# Patient Record
Sex: Female | Born: 1960 | Race: White | Hispanic: No | Marital: Married | Smoking: Never smoker
Health system: Southern US, Community
[De-identification: ages and names within clinical notes are randomized; demographics above are authoritative.]

## PROBLEM LIST (undated history)

## (undated) DIAGNOSIS — Z8489 Family history of other specified conditions: Secondary | ICD-10-CM

## (undated) DIAGNOSIS — N952 Postmenopausal atrophic vaginitis: Secondary | ICD-10-CM

## (undated) DIAGNOSIS — M5136 Other intervertebral disc degeneration, lumbar region: Secondary | ICD-10-CM

## (undated) DIAGNOSIS — J302 Other seasonal allergic rhinitis: Secondary | ICD-10-CM

## (undated) DIAGNOSIS — R5383 Other fatigue: Secondary | ICD-10-CM

## (undated) DIAGNOSIS — R638 Other symptoms and signs concerning food and fluid intake: Secondary | ICD-10-CM

## (undated) DIAGNOSIS — R200 Anesthesia of skin: Secondary | ICD-10-CM

## (undated) DIAGNOSIS — IMO0002 Reserved for concepts with insufficient information to code with codable children: Secondary | ICD-10-CM

## (undated) DIAGNOSIS — E079 Disorder of thyroid, unspecified: Secondary | ICD-10-CM

## (undated) DIAGNOSIS — I10 Essential (primary) hypertension: Secondary | ICD-10-CM

## (undated) DIAGNOSIS — H168 Other keratitis: Secondary | ICD-10-CM

## (undated) DIAGNOSIS — M51369 Other intervertebral disc degeneration, lumbar region without mention of lumbar back pain or lower extremity pain: Secondary | ICD-10-CM

## (undated) DIAGNOSIS — Z780 Asymptomatic menopausal state: Secondary | ICD-10-CM

## (undated) HISTORY — DX: Reserved for concepts with insufficient information to code with codable children: IMO0002

## (undated) HISTORY — DX: Postmenopausal atrophic vaginitis: N95.2

## (undated) HISTORY — DX: Other intervertebral disc degeneration, lumbar region: M51.36

## (undated) HISTORY — DX: Disorder of thyroid, unspecified: E07.9

## (undated) HISTORY — DX: Other keratitis: H16.8

## (undated) HISTORY — PX: TONSILLECTOMY: SUR1361

## (undated) HISTORY — DX: Other symptoms and signs concerning food and fluid intake: R63.8

## (undated) HISTORY — DX: Other fatigue: R53.83

## (undated) HISTORY — PX: CARDIAC CATHETERIZATION: SHX172

## (undated) HISTORY — DX: Essential (primary) hypertension: I10

## (undated) HISTORY — DX: Other seasonal allergic rhinitis: J30.2

## (undated) HISTORY — DX: Asymptomatic menopausal state: Z78.0

## (undated) HISTORY — DX: Other intervertebral disc degeneration, lumbar region without mention of lumbar back pain or lower extremity pain: M51.369

---
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
## 2000-08-13 HISTORY — PX: VAGINAL HYSTERECTOMY: SUR661

## 2000-10-21 ENCOUNTER — Encounter: Payer: Self-pay | Admitting: Cardiovascular Disease

## 2004-05-19 ENCOUNTER — Ambulatory Visit: Payer: Self-pay | Admitting: Gastroenterology

## 2005-01-30 ENCOUNTER — Ambulatory Visit: Payer: Self-pay | Admitting: Family Medicine

## 2006-02-22 ENCOUNTER — Ambulatory Visit: Payer: Self-pay | Admitting: Obstetrics and Gynecology

## 2006-12-31 ENCOUNTER — Ambulatory Visit: Payer: Self-pay

## 2007-02-25 ENCOUNTER — Ambulatory Visit: Payer: Self-pay | Admitting: Obstetrics and Gynecology

## 2008-03-04 ENCOUNTER — Ambulatory Visit: Payer: Self-pay | Admitting: Obstetrics and Gynecology

## 2009-03-08 ENCOUNTER — Ambulatory Visit: Payer: Self-pay

## 2009-08-13 LAB — HM COLONOSCOPY: HM COLON: NORMAL

## 2009-10-27 ENCOUNTER — Ambulatory Visit: Payer: Self-pay | Admitting: Gastroenterology

## 2010-01-03 ENCOUNTER — Encounter: Payer: Self-pay | Admitting: Cardiovascular Disease

## 2010-03-09 ENCOUNTER — Ambulatory Visit: Payer: Self-pay

## 2010-12-12 ENCOUNTER — Encounter: Payer: Self-pay | Admitting: Cardiovascular Disease

## 2010-12-19 ENCOUNTER — Ambulatory Visit: Payer: Self-pay | Admitting: Family Medicine

## 2010-12-25 ENCOUNTER — Ambulatory Visit (INDEPENDENT_AMBULATORY_CARE_PROVIDER_SITE_OTHER): Payer: No Typology Code available for payment source | Admitting: Cardiovascular Disease

## 2010-12-25 ENCOUNTER — Encounter: Payer: Self-pay | Admitting: Cardiovascular Disease

## 2010-12-25 DIAGNOSIS — E669 Obesity, unspecified: Secondary | ICD-10-CM

## 2010-12-25 DIAGNOSIS — I10 Essential (primary) hypertension: Secondary | ICD-10-CM | POA: Insufficient documentation

## 2010-12-25 DIAGNOSIS — R0602 Shortness of breath: Secondary | ICD-10-CM

## 2010-12-25 DIAGNOSIS — R002 Palpitations: Secondary | ICD-10-CM

## 2010-12-25 MED ORDER — METOPROLOL TARTRATE 25 MG PO TABS: 25.0000 mg | ORAL_TABLET | Freq: Two times a day (BID) | ORAL | Status: DC | PRN

## 2010-12-25 NOTE — Assessment & Plan Note (Signed)
She does have occasional palpitations. This could be secondary to an elevated heart rate or to ectopy. We have suggested she start the beta blocker as mentioned.

## 2010-12-25 NOTE — Patient Instructions (Signed)
Start Metoprolol Tartrate 25mg  twice daily as needed for palpitations.  You can also try Bystolic 5mg  samples, to see if these work better for you. **IF you do take metoprolol OR Bystolic, cut Diovan dose in HALF** Please call our office with any issues in the future, otherwise, you can follow up as needed.

## 2010-12-25 NOTE — Progress Notes (Signed)
   Patient ID: Shari Long, female    DOB: 08/30/1960, 50 y.o.   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  She reports that she started to work with a trainer several months ago. She worked for 6 weeks with him and has lost 20 pounds. During this period, she has had significant periods of tachycardia palpitations, shortness of breath. She stopped 3 weeks ago and has felt better since then. She is able to do her aerobic activities with no significant symptoms of shortness of breath. She also reports having occasional dizziness with exertion. After she has worked out, she has some exhaustion, fatigue, sometimes some chest heaviness.  With her weight decreasing, her cholesterol has decreased from 170s down to 155, LDL 100.  EKG shows normal sinus rhythm with rate 92 beats per minute and his overall essentially normal     Review of Systems  Constitutional: Negative.   HENT: Negative.   Eyes: Negative.   Respiratory: Positive for shortness of breath.   Cardiovascular: Positive for chest pain and palpitations.  Gastrointestinal: Negative.   Musculoskeletal: Negative.   Skin: Negative.   Neurological: Negative.   Hematological: Negative.   Psychiatric/Behavioral: Negative.   All other systems reviewed and are negative.    BP 122/86  Pulse 92  Ht 5\' 4"  (1.626 m)  Wt 196 lb (88.905 kg)  BMI 33.64 kg/m2   Physical Exam  Nursing note and vitals reviewed. Constitutional: She is oriented to person, place, and time. She appears well-developed and well-nourished.  HENT:  Head: Normocephalic.  Nose:  Nose normal.  Mouth/Throat: Oropharynx is clear and moist.  Eyes: Conjunctivae are normal. Pupils are equal, round, and reactive to light.  Neck: Normal range of motion. Neck supple. No JVD present.  Cardiovascular: Normal rate, regular rhythm, S1 normal, S2 normal, normal heart sounds and intact distal pulses.  Exam reveals no gallop and no friction rub.   No murmur heard. Pulmonary/Chest: Effort normal and breath sounds normal. No respiratory distress. She has no wheezes. She has no rales. She exhibits no tenderness.  Abdominal: Soft. Bowel sounds are normal. She exhibits no distension. There is no tenderness.  Musculoskeletal: Normal range of motion. She exhibits no edema and no tenderness.  Lymphadenopathy:    She has no cervical adenopathy.  Neurological: She is alert and oriented to person, place, and time. Coordination normal.  Skin: Skin is warm and dry. No rash noted. No erythema.  Psychiatric: She has a normal mood and affect. Her behavior is normal. Judgment and thought content normal.         Assessment and Plan

## 2010-12-25 NOTE — Assessment & Plan Note (Signed)
Etiology of her shortness of breath could be secondary to an elevated heart rate. At rest on EKG and exam, her heart rate is in the high 80s, low 90s. She reports that her heart rate is typically always elevated. We have suggested that she try a low-dose beta blocker prior to any workup. We have suggested she cut her Diovan and half, take metoprolol tartrate either as needed or on a regular basis b.i.d.. Alternatively, she could try Bystolic 5 mg and we have given her some samples to try.  If she does not have improvement of her shortness of breath with better heart rate, we have suggested that we could do additional workup including echocardiogram, treadmill study.

## 2010-12-25 NOTE — Assessment & Plan Note (Signed)
Blood pressure is well-controlled on her visit today. We have suggested that if she had a low-dose beta blocker, that she cut her Diovan in half.

## 2010-12-25 NOTE — Assessment & Plan Note (Signed)
She has dropped her weight 20 pounds with working with the trainer. We have complement of her ear it we have suggested she continue to exercise as she is doing.

## 2011-02-20 ENCOUNTER — Ambulatory Visit: Payer: Self-pay

## 2011-03-02 ENCOUNTER — Telehealth: Payer: Self-pay

## 2011-03-02 NOTE — Telephone Encounter (Signed)
Patient calling with medication problems. She just finished her bystolic samples and was told to take Metoprolol 25 mg 1/2 tablet twice a day instead she is taking once a day. She started the metoprolol 25 mg 1/2 tablet once daily on 02/24/2011. The patient states the bystolic seemed to work much better than the metoprolol.  She has felt weak with the metoprolol and has more break through palpitations and shortness of breath.  She did not feel this way on the bystolic. What do you recommend she do?

## 2011-03-04 NOTE — Telephone Encounter (Signed)
We can provide additional samples of bystolic 5 mg She should monitor her heart rate. If elevated, may need to try 10 mg daily. Once we determine which dose (5 or 10 mg), we can giver her a scipt with copay card.  We can also write for scipt for 10 mg with copay card and she can try the 10 mg or cut in half for 5 mg

## 2011-03-05 NOTE — Telephone Encounter (Signed)
Notified regarding the metoprolol and bystolic. Gave samples for bystolic 10 mg; told should take 1/2 tablet and if the heart rate seems to be getting worse take the other half tablet to equal the bystolic 10 mg.  Told her to monitor blood pressure and heart rate and to let us know which dose works best of the bystolic and we can give her a Rx with a copay card to help with the expense.

## 2011-10-17 ENCOUNTER — Other Ambulatory Visit: Payer: Self-pay | Admitting: Pain Medicine

## 2011-10-17 ENCOUNTER — Ambulatory Visit: Payer: Self-pay | Admitting: Pain Medicine

## 2011-10-17 LAB — CBC WITH DIFFERENTIAL/PLATELET
Basophil #: 0 10*3/uL (ref 0.0–0.1)
Eosinophil #: 0.3 10*3/uL (ref 0.0–0.7)
Eosinophil %: 3.4 %
HCT: 42.1 % (ref 35.0–47.0)
HGB: 14.2 g/dL (ref 12.0–16.0)
Lymphocyte #: 2.2 10*3/uL (ref 1.0–3.6)
Lymphocyte %: 29.3 %
MCH: 29.3 pg (ref 26.0–34.0)
MCHC: 33.8 g/dL (ref 32.0–36.0)
Monocyte #: 0.5 10*3/uL (ref 0.0–0.7)
Neutrophil #: 4.4 10*3/uL (ref 1.4–6.5)
Neutrophil %: 59.4 %
RBC: 4.86 10*6/uL (ref 3.80–5.20)

## 2011-10-31 ENCOUNTER — Ambulatory Visit: Payer: Self-pay | Admitting: Pain Medicine

## 2012-02-21 ENCOUNTER — Ambulatory Visit: Payer: Self-pay | Admitting: Family Medicine

## 2012-07-18 ENCOUNTER — Other Ambulatory Visit: Payer: Self-pay | Admitting: Physician Assistant

## 2012-07-25 ENCOUNTER — Ambulatory Visit: Payer: Self-pay | Admitting: Unknown Physician Specialty

## 2012-08-11 ENCOUNTER — Ambulatory Visit: Payer: Self-pay | Admitting: Unknown Physician Specialty

## 2013-02-25 ENCOUNTER — Ambulatory Visit: Payer: Self-pay | Admitting: Family Medicine

## 2013-10-14 ENCOUNTER — Other Ambulatory Visit: Payer: Self-pay | Admitting: Family Medicine

## 2013-10-14 LAB — COMPREHENSIVE METABOLIC PANEL
ALBUMIN: 3.7 g/dL (ref 3.4–5.0)
ALK PHOS: 134 U/L — AB
ANION GAP: 3 — AB (ref 7–16)
AST: 36 U/L (ref 15–37)
BUN: 11 mg/dL (ref 7–18)
Bilirubin,Total: 0.3 mg/dL (ref 0.2–1.0)
Calcium, Total: 9.5 mg/dL (ref 8.5–10.1)
Chloride: 106 mmol/L (ref 98–107)
Co2: 30 mmol/L (ref 21–32)
Creatinine: 0.72 mg/dL (ref 0.60–1.30)
EGFR (African American): >60
GLUCOSE: 83 mg/dL (ref 65–99)
Osmolality: 276 (ref 275–301)
POTASSIUM: 3.7 mmol/L (ref 3.5–5.1)
SGPT (ALT): 39 U/L (ref 12–78)
SODIUM: 139 mmol/L (ref 136–145)
TOTAL PROTEIN: 7.3 g/dL (ref 6.4–8.2)

## 2013-10-14 LAB — TSH: THYROID STIMULATING HORM: 0.014 u[IU]/mL — AB

## 2014-03-17 ENCOUNTER — Ambulatory Visit: Payer: Self-pay | Admitting: Family Medicine

## 2014-04-28 ENCOUNTER — Ambulatory Visit: Payer: Self-pay | Admitting: Family Medicine

## 2014-06-18 ENCOUNTER — Ambulatory Visit: Payer: Self-pay | Admitting: Family Medicine

## 2014-07-23 ENCOUNTER — Ambulatory Visit: Payer: Self-pay | Admitting: Gastroenterology

## 2014-08-13 LAB — HM MAMMOGRAPHY: HM Mammogram: NORMAL (ref 0–4)

## 2014-11-23 ENCOUNTER — Encounter: Payer: Self-pay | Admitting: *Deleted

## 2014-11-24 ENCOUNTER — Encounter: Payer: Self-pay | Admitting: *Deleted

## 2014-12-06 LAB — SURGICAL PATHOLOGY

## 2015-02-15 ENCOUNTER — Other Ambulatory Visit: Payer: Self-pay | Admitting: Family Medicine

## 2015-03-11 ENCOUNTER — Other Ambulatory Visit: Payer: Self-pay | Admitting: Family Medicine

## 2015-03-11 DIAGNOSIS — Z1231 Encounter for screening mammogram for malignant neoplasm of breast: Secondary | ICD-10-CM

## 2015-03-11 NOTE — Telephone Encounter (Signed)
Pt has not been seen since 04/20/14. Please advise

## 2015-03-22 ENCOUNTER — Ambulatory Visit
Admission: RE | Admit: 2015-03-22 | Discharge: 2015-03-22 | Disposition: A | Discharge status: Home / Self Care | Payer: 59 | Source: Ambulatory Visit | Attending: Family Medicine | Admitting: Family Medicine

## 2015-03-22 ENCOUNTER — Other Ambulatory Visit: Payer: Self-pay | Admitting: Family Medicine

## 2015-03-22 DIAGNOSIS — Z1231 Encounter for screening mammogram for malignant neoplasm of breast: Secondary | ICD-10-CM

## 2015-05-17 ENCOUNTER — Encounter: Payer: Self-pay | Admitting: Family Medicine

## 2015-05-17 ENCOUNTER — Ambulatory Visit (INDEPENDENT_AMBULATORY_CARE_PROVIDER_SITE_OTHER): Payer: 59 | Admitting: Family Medicine

## 2015-05-17 VITALS — BP 122/80 | HR 90 | Temp 98.8°F | Resp 16 | Ht 64.0 in | Wt 198.8 lb

## 2015-05-17 DIAGNOSIS — E669 Obesity, unspecified: Secondary | ICD-10-CM | POA: Diagnosis not present

## 2015-05-17 DIAGNOSIS — F329 Major depressive disorder, single episode, unspecified: Secondary | ICD-10-CM

## 2015-05-17 DIAGNOSIS — I10 Essential (primary) hypertension: Secondary | ICD-10-CM

## 2015-05-17 DIAGNOSIS — F32A Depression, unspecified: Secondary | ICD-10-CM

## 2015-05-17 DIAGNOSIS — E039 Hypothyroidism, unspecified: Secondary | ICD-10-CM | POA: Diagnosis not present

## 2015-05-17 MED ORDER — VALSARTAN-HYDROCHLOROTHIAZIDE 160-12.5 MG PO TABS: 1.0000 | ORAL_TABLET | Freq: Every day | ORAL | Status: DC

## 2015-05-17 MED ORDER — LEVOTHYROXINE SODIUM 125 MCG PO TABS: 125.0000 ug | ORAL_TABLET | Freq: Every day | ORAL | Status: DC

## 2015-05-17 MED ORDER — HYDROCHLOROTHIAZIDE 25 MG PO TABS: 25.0000 mg | ORAL_TABLET | ORAL | Status: DC | PRN

## 2015-05-17 MED ORDER — BUPROPION HCL ER (XL) 300 MG PO TB24: 300.0000 mg | ORAL_TABLET | Freq: Every day | ORAL | Status: DC

## 2015-05-17 NOTE — Progress Notes (Signed)
Name: Shari Long   MRN: [id number]    DOB: February 24, 1961   Date:05/17/2015       Progress Note  Subjective  Chief Complaint  Chief Complaint  Patient presents with  . Depression    4 month follow up  . Hypothyroidism  . Hypertension    HPI  Hypertension   Patient presents for follow-up of hypertension. It has been present for over 5 years.  Patient states that there is compliance with medical regimen which consists of  Metoprolol 25 mg daily and hydrochlorothiazide 25 mg daily. There is no end organ disease. Cardiac risk factors include hypertension hyperlipidemia and diabetes.  Exercise regimen consist of some calisthenics as well as some aerobic exercise .  Diet consist of low-fat low carb .  Obesity  Patient has a history of obesity for many years.  Attempts at weight loss have included diet and exercise and dieting .  Results of this regimen  have been successful over the past several months with the loss of 16 pounds .  Patient now voices and interest in weight loss by  diet and exercise    Hypothyroidism  . It has been present for over 5 years.  Current symptoms consist of palpitations occasionally . Current medication regimen consist of levothyroxin 125 g and Cytomel 25 g daily micrograms daily .   There is good compliance with regimen.    Depression  Patient depression symptoms are doing well currently. She remains on Wellbutrin  Past Medical History  Diagnosis Date  . Hypertension   . Thyroid disease     hypothyroidism  . Degenerative disc disease     Social History  Substance Use Topics  . Smoking status: Never Smoker   . Smokeless tobacco: Not on file  . Alcohol Use: 0.5 oz/week    1 drink(s) per week     Current outpatient prescriptions:  .  aspirin 81 MG tablet, Take 81 mg by mouth daily.  , Disp: , Rfl:  .  buPROPion (WELLBUTRIN XL) 300 MG 24 hr tablet, Take 1 tablet (300 mg total) by mouth daily., Disp: 90 tablet, Rfl: 1 .  calcium  citrate-vitamin D 200-200 MG-UNIT TABS, Take 1 tablet by mouth daily.  , Disp: , Rfl:  .  hydrochlorothiazide (HYDRODIURIL) 25 MG tablet, Take 1 tablet (25 mg total) by mouth as needed., Disp: 90 tablet, Rfl: 1 .  levothyroxine (SYNTHROID, LEVOTHROID) 125 MCG tablet, Take 1 tablet (125 mcg total) by mouth daily., Disp: 90 tablet, Rfl: 1 .  liothyronine (CYTOMEL) 25 MCG tablet, Take 25 mcg by mouth daily.  , Disp: , Rfl:  .  metoprolol tartrate (LOPRESSOR) 25 MG tablet, Take 1 tablet (25 mg total) by mouth 2 (two) times daily as needed., Disp: 60 tablet, Rfl: 6 .  Omega-3 Fatty Acids (FISH OIL) 1000 MG CAPS, Take by mouth.  , Disp: , Rfl:  .  valsartan-hydrochlorothiazide (DIOVAN-HCT) 160-12.5 MG tablet, Take 1 tablet by mouth daily., Disp: 90 tablet, Rfl: 1 .  vitamin E 400 UNIT capsule, Take 400 Units by mouth daily.  , Disp: , Rfl:   No Known Allergies  Review of Systems  Constitutional: Negative for fever, chills and weight loss.  HENT: Negative for congestion, hearing loss, sore throat and tinnitus.   Eyes: Negative for blurred vision, double vision and redness.  Respiratory: Negative for cough, hemoptysis and shortness of breath.   Cardiovascular: Positive for palpitations. Negative for chest pain, orthopnea, claudication and leg swelling.  Gastrointestinal:  Negative for heartburn, nausea, vomiting, diarrhea, constipation and blood in stool.  Genitourinary: Negative for dysuria, urgency, frequency and hematuria.  Musculoskeletal: Negative for myalgias, back pain, joint pain, falls and neck pain.  Skin: Negative for itching.  Neurological: Negative for dizziness, tingling, tremors, focal weakness, seizures, loss of consciousness, weakness and headaches.  Endo/Heme/Allergies: Does not bruise/bleed easily.  Psychiatric/Behavioral: Positive for depression. Negative for substance abuse. The patient is not nervous/anxious and does not have insomnia.      Objective  Filed Vitals:    05/17/15 0749  BP: 122/80  Pulse: 90  Temp: 98.8 F (37.1 C)  TempSrc: Oral  Resp: 16  Height:  (1.626 m)  Weight: 198 lb 12.8 oz (90.175 kg)  SpO2: 96%     Physical Exam  Constitutional: She is oriented to person, place, and time and well-developed, well-nourished, and in no distress.  Modestly obese in no acute distress  HENT:  Head: Normocephalic.  Eyes: EOM are normal. Pupils are equal, round, and reactive to light.  Neck: Normal range of motion. No thyromegaly present.  Cardiovascular: Normal rate, regular rhythm and normal heart sounds.   No murmur heard. Pulmonary/Chest: Effort normal and breath sounds normal.  Abdominal: Soft. Bowel sounds are normal.  Musculoskeletal: Normal range of motion. Tenderness: trace.  Neurological: She is alert and oriented to person, place, and time. No cranial nerve deficit. Gait normal.  Skin: Skin is warm and dry. No rash noted.  Psychiatric: Memory and affect normal.      Assessment & Plan  1. Hypothyroidism, unspecified hypothyroidism type Recheck labs - Lipid Profile - TSH - T3 Uptake  2. Essential hypertension Recheck labs  3. Obesity improved  4. Depression stable - Comprehensive Metabolic Panel (CMET)

## 2015-05-19 LAB — COMPREHENSIVE METABOLIC PANEL
ALK PHOS: 112 IU/L (ref 39–117)
ALT: 20 IU/L (ref 0–32)
AST: 26 IU/L (ref 0–40)
Albumin/Globulin Ratio: 1.5 (ref 1.1–2.5)
Albumin: 4.3 g/dL (ref 3.5–5.5)
BUN/Creatinine Ratio: 13 (ref 9–23)
BUN: 11 mg/dL (ref 6–24)
Bilirubin Total: 0.2 mg/dL (ref 0.0–1.2)
CALCIUM: 9.7 mg/dL (ref 8.7–10.2)
CO2: 26 mmol/L (ref 18–29)
CREATININE: 0.86 mg/dL (ref 0.57–1.00)
Chloride: 100 mmol/L (ref 97–108)
GFR calc Af Amer: 88 mL/min/{1.73_m2} (ref >59)
GFR, EST NON AFRICAN AMERICAN: 76 mL/min/{1.73_m2} (ref >59)
Globulin, Total: 2.8 g/dL (ref 1.5–4.5)
Glucose: 94 mg/dL (ref 65–99)
POTASSIUM: 4.6 mmol/L (ref 3.5–5.2)
Sodium: 140 mmol/L (ref 134–144)
Total Protein: 7.1 g/dL (ref 6.0–8.5)

## 2015-05-19 LAB — LIPID PANEL
CHOL/HDL RATIO: 4 ratio (ref 0.0–4.4)
CHOLESTEROL TOTAL: 207 mg/dL — AB (ref 100–199)
HDL: 52 mg/dL (ref >39)
LDL CALC: 135 mg/dL — AB (ref 0–99)
TRIGLYCERIDES: 98 mg/dL (ref 0–149)
VLDL Cholesterol Cal: 20 mg/dL (ref 5–40)

## 2015-05-19 LAB — T3 UPTAKE: T3 Uptake Ratio: 27 % (ref 24–39)

## 2015-05-19 LAB — TSH: TSH: 0.803 u[IU]/mL (ref 0.450–4.500)

## 2015-05-24 ENCOUNTER — Telehealth: Payer: Self-pay | Admitting: Emergency Medicine

## 2015-05-24 NOTE — Telephone Encounter (Signed)
Patient notified

## 2015-07-13 ENCOUNTER — Encounter: Payer: Self-pay | Admitting: Physician Assistant

## 2015-07-13 ENCOUNTER — Ambulatory Visit: Payer: Self-pay | Admitting: Physician Assistant

## 2015-07-13 VITALS — BP 124/90 | HR 82 | Temp 98.4°F

## 2015-07-13 DIAGNOSIS — H65192 Other acute nonsuppurative otitis media, left ear: Secondary | ICD-10-CM

## 2015-07-13 MED ORDER — PREDNISONE 10 MG PO TABS
30.0000 mg | ORAL_TABLET | Freq: Every day | ORAL | Status: DC
Start: 2015-07-13 — End: 2015-11-24

## 2015-07-13 MED ORDER — AMOXICILLIN 875 MG PO TABS: 875.0000 mg | ORAL_TABLET | Freq: Two times a day (BID) | ORAL | Status: DC

## 2015-07-13 MED ORDER — FLUCONAZOLE 150 MG PO TABS: 150.0000 mg | ORAL_TABLET | Freq: Once | ORAL | Status: DC

## 2015-07-13 NOTE — Progress Notes (Signed)
S:  C/o ears popping and being stopped up, a lot or pressure,  no drainage from ears, no fever/chills, some cough and congestion, some sinus pressure, remainder ros neg, mucus was yellow but has turned clear Using otc meds without relief  O:  Vitals wnl, nad, tms dull b/l, left is pink;  nasal mucosa swollen, throat wnl, neck supple no lymph, lungs c t a, cv rrr, neuro intact  A: acute otitis media  P: amoxil 875mg  bid x 10d, diflucan if needed, prednisone 30mg  qd x 3d, return if not improving in 3 to 5 days, return earlier if worsening

## 2015-11-21 LAB — HM PAP SMEAR

## 2015-11-24 ENCOUNTER — Ambulatory Visit (INDEPENDENT_AMBULATORY_CARE_PROVIDER_SITE_OTHER): Payer: 59 | Admitting: Obstetrics and Gynecology

## 2015-11-24 ENCOUNTER — Encounter: Payer: Self-pay | Admitting: Obstetrics and Gynecology

## 2015-11-24 VITALS — BP 126/83 | HR 76 | Ht 64.0 in | Wt 209.6 lb

## 2015-11-24 DIAGNOSIS — Z780 Asymptomatic menopausal state: Secondary | ICD-10-CM | POA: Diagnosis not present

## 2015-11-24 DIAGNOSIS — E669 Obesity, unspecified: Secondary | ICD-10-CM

## 2015-11-24 DIAGNOSIS — Z1239 Encounter for other screening for malignant neoplasm of breast: Secondary | ICD-10-CM

## 2015-11-24 DIAGNOSIS — N952 Postmenopausal atrophic vaginitis: Secondary | ICD-10-CM

## 2015-11-24 DIAGNOSIS — Z01419 Encounter for gynecological examination (general) (routine) without abnormal findings: Secondary | ICD-10-CM

## 2015-11-24 DIAGNOSIS — Z0000 Encounter for general adult medical examination without abnormal findings: Secondary | ICD-10-CM

## 2015-11-24 DIAGNOSIS — Z90710 Acquired absence of both cervix and uterus: Secondary | ICD-10-CM | POA: Diagnosis not present

## 2015-11-24 MED ORDER — ESTRADIOL 0.1 MG/GM VA CREA: 1.0000 | TOPICAL_CREAM | VAGINAL | Status: DC

## 2015-11-24 NOTE — Progress Notes (Signed)
GYN ANNUAL PREVENTATIVE CARE ENCOUNTER NOTE  Subjective:       Shari Long is a 55 y.o. G45P2000 female here for a routine annual gynecologic exam.  Current complaints: None.      Status post TVH. Weight is an ongoing issue with fluctuating increase and decrease. Gynecologic History No LMP recorded. Patient has had a hysterectomy. Contraception: status post hysterectomy Last Pap: Normal Last mammogram: Normal  Obstetric History OB History  Gravida Para Term Preterm AB SAB TAB Ectopic Multiple Living  # Outcome Date GA Lbr Len/2nd Weight Sex Delivery Anes PTL Lv  2 Term      Vag-Spont     1 Term      Vag-Spont         Past Medical History  Diagnosis Date  . Hypertension   . Thyroid disease     hypothyroidism  . Degenerative disc disease   . DDD (degenerative disc disease), lumbar   . Seasonal allergies   . Vaginal atrophy   . Increased BMI   . Menopause   . Fatigue     Past Surgical History  Procedure Laterality Date  . Cardiac catheterization    . Foot surgery  1978    left foot   . Tonsillectomy    . Vaginal hysterectomy  2002    Current Outpatient Prescriptions on File Prior to Visit  Medication Sig Dispense Refill  . amoxicillin (AMOXIL) 875 MG tablet Take 1 tablet (875 mg total) by mouth 2 (two) times daily. 20 tablet 0  . aspirin 81 MG tablet Take 81 mg by mouth daily.      Marland Kitchen buPROPion (WELLBUTRIN XL) 300 MG 24 hr tablet Take 1 tablet (300 mg total) by mouth daily. 90 tablet 1  . buPROPion (WELLBUTRIN XL) 300 MG 24 hr tablet Take by mouth.    . calcium citrate-vitamin D 200-200 MG-UNIT TABS Take 1 tablet by mouth daily.      . fluconazole (DIFLUCAN) 150 MG tablet Take 1 tablet (150 mg total) by mouth once. 1 tablet 0  . hydrochlorothiazide (HYDRODIURIL) 25 MG tablet Take 1 tablet (25 mg total) by mouth as needed. (Patient not taking: Reported on 07/13/2015) 90 tablet 1  . levothyroxine (SYNTHROID, LEVOTHROID) 125 MCG tablet Take 1  tablet (125 mcg total) by mouth daily. 90 tablet 1  . liothyronine (CYTOMEL) 25 MCG tablet Take 25 mcg by mouth daily.      . metoprolol tartrate (LOPRESSOR) 25 MG tablet Take 1 tablet (25 mg total) by mouth 2 (two) times daily as needed. 60 tablet 6  . Omega-3 Fatty Acids (FISH OIL) 1000 MG CAPS Take by mouth.      . predniSONE (DELTASONE) 10 MG tablet Take 3 tablets (30 mg total) by mouth daily with breakfast. 9 tablet 0  . valsartan-hydrochlorothiazide (DIOVAN-HCT) 160-12.5 MG tablet Take 1 tablet by mouth daily. 90 tablet 1  . vitamin E 400 UNIT capsule Take 400 Units by mouth daily.       No current facility-administered medications on file prior to visit.    Allergies  Allergen Reactions  . Penicillins Rash    Social History   Social History  . Marital Status: Married    Spouse Name: N/A  . Number of Children: N/A  . Years of Education: N/A   Occupational History  . Not on file.   Social History Main Topics  . Smoking status: Never  Smoker   . Smokeless tobacco: Not on file  . Alcohol Use: 0.5 oz/week    1 Standard drinks or equivalent per week  . Drug Use: No  . Sexual Activity: Yes    Birth Control/ Protection: Surgical   Other Topics Concern  . Not on file   Social History Narrative    Family History  Problem Relation Age of Onset  . Breast cancer Paternal Aunt 48    great  . Breast cancer Paternal Uncle [id number]  . Colon cancer Paternal Uncle   . Breast cancer Paternal Grandmother [id number]  . Cancer - Cervical Mother   . Diabetes Mother   . Breast cancer Cousin     ovarian (50's), breast(50's) and uterine(60's) cancer on maternal side.   . Ovarian cancer Cousin   . Heart disease Maternal Grandmother   . Heart disease Paternal Grandfather     The following portions of the patient's history were reviewed and updated as appropriate: allergies, current medications, past family history, past medical history, past social history, past surgical history and problem  list.  Review of Systems ROS   Objective:   BP 126/83 mmHg  Pulse 76  Ht 5\' 4"  (1.626 m)  Wt 209 lb 9 oz (95.057 kg)  BMI 35.95 kg/m2 Physical Exam  Assessment:   Annual gynecologic examination 55 y.o. Contraception: status post hysterectomy Obesity 1 Patient Active Problem List   Diagnosis Date Noted  . Hypothyroidism 05/17/2015  . Palpitations 12/25/2010  . HTN (hypertension) 12/25/2010  . SOB (shortness of breath) 12/25/2010  . Obesity 12/25/2010     Plan:  Pap: Not needed and Not done Mammogram: Ordered Labs: Through PCP Routine preventative health maintenance measures emphasized: Diet/Weight control, Tobacco Cessation and Alcohol/Drug use Restart Estrace cream 1/2 g intravaginal twice weekly Return to Clinic - 1 Year   Herold HarmsMartin A Defrancesco, MD  Note: This dictation was prepared with Dragon dictation along with smaller phrase technology. Any transcriptional errors that result from this process are unintentional.

## 2015-11-24 NOTE — Patient Instructions (Signed)
1. No Pap smear needed 2. Mammogram ordered 3. Encourage calcium with vitamin D supplementation 4. Stool guaiac cards recommended (declined) 5. Continue with healthy eating and exercise with slow weight loss 6. Begin Estrace cream 1/2 g intravaginal twice a week 7. Return in 1 year

## 2016-03-06 ENCOUNTER — Ambulatory Visit: Payer: Self-pay | Admitting: Physician Assistant

## 2016-03-06 ENCOUNTER — Encounter: Payer: Self-pay | Admitting: Physician Assistant

## 2016-03-06 VITALS — BP 120/88 | HR 84 | Temp 97.7°F

## 2016-03-06 DIAGNOSIS — J0100 Acute maxillary sinusitis, unspecified: Secondary | ICD-10-CM

## 2016-03-06 MED ORDER — FLUTICASONE PROPIONATE 50 MCG/ACT NA SUSP: 2.0000 | Freq: Every day | NASAL | 6 refills | Status: DC

## 2016-03-06 NOTE — Progress Notes (Addendum)
S: C/o runny nose and congestion for 3 days, no fever, chills, cp/sob, v/d;c/o of facial and dental pain. Not producing mucus, did have a cold about 2 weeks ago and feels it has returned  Using otc meds:   O: PE: perrl eomi, normocephalic, tms dull, nasal mucosa boggy and swollen, throat injected, neck supple no lymph, lungs c t a, cv rrr, neuro intact  A:  Acute sinusitis   P: drink fluids, continue regular meds , use otc meds of choice, return if not improving in 5 days, return earlier if worsening , flonase, if not better by end of the week will call in zpack   Called in zpack, pt called office and states she is not better

## 2016-03-08 MED ORDER — AZITHROMYCIN 250 MG PO TABS: ORAL_TABLET | ORAL | 0 refills | Status: DC

## 2016-03-08 NOTE — Addendum Note (Signed)
Addended by: Faythe Ghee on: 03/08/2016 08:47 AM   Modules accepted: Orders

## 2016-03-22 ENCOUNTER — Other Ambulatory Visit: Payer: Self-pay | Admitting: Obstetrics and Gynecology

## 2016-03-22 ENCOUNTER — Ambulatory Visit
Admission: RE | Admit: 2016-03-22 | Discharge: 2016-03-22 | Disposition: A | Discharge status: Home / Self Care | Payer: 59 | Source: Ambulatory Visit | Attending: Obstetrics and Gynecology | Admitting: Obstetrics and Gynecology

## 2016-03-22 DIAGNOSIS — Z1231 Encounter for screening mammogram for malignant neoplasm of breast: Secondary | ICD-10-CM | POA: Insufficient documentation

## 2016-03-22 DIAGNOSIS — Z1239 Encounter for other screening for malignant neoplasm of breast: Secondary | ICD-10-CM

## 2016-03-23 ENCOUNTER — Other Ambulatory Visit: Payer: Self-pay | Admitting: Family Medicine

## 2016-03-28 ENCOUNTER — Other Ambulatory Visit: Payer: Self-pay

## 2016-03-28 ENCOUNTER — Telehealth: Payer: Self-pay | Admitting: Family Medicine

## 2016-03-28 MED ORDER — LEVOTHYROXINE SODIUM 125 MCG PO TABS: 125.0000 ug | ORAL_TABLET | Freq: Every day | ORAL | 0 refills | Status: DC

## 2016-03-28 NOTE — Telephone Encounter (Signed)
Patient requesting refill of her Levothyroxine 125 mcg be sent to [facility].

## 2016-03-28 NOTE — Telephone Encounter (Signed)
This is a patient of Dr. Thana AtesMorrisey.  She said her husband [name] sees you.  She wants to become a patient of yours.   He treats her for Hypertension and hypothyroid.  Please advise.  [phone]  Thanks, Shari Long

## 2016-03-28 NOTE — Telephone Encounter (Signed)
Please review-aa 

## 2016-03-29 ENCOUNTER — Ambulatory Visit (INDEPENDENT_AMBULATORY_CARE_PROVIDER_SITE_OTHER): Payer: 59 | Admitting: Family Medicine

## 2016-03-29 ENCOUNTER — Encounter: Payer: Self-pay | Admitting: Family Medicine

## 2016-03-29 VITALS — BP 116/74 | HR 93 | Temp 98.3°F | Resp 18 | Ht 64.0 in | Wt 211.0 lb

## 2016-03-29 DIAGNOSIS — E063 Autoimmune thyroiditis: Secondary | ICD-10-CM | POA: Diagnosis not present

## 2016-03-29 DIAGNOSIS — K219 Gastro-esophageal reflux disease without esophagitis: Secondary | ICD-10-CM

## 2016-03-29 DIAGNOSIS — Z1159 Encounter for screening for other viral diseases: Secondary | ICD-10-CM | POA: Diagnosis not present

## 2016-03-29 DIAGNOSIS — Z13220 Encounter for screening for lipoid disorders: Secondary | ICD-10-CM

## 2016-03-29 DIAGNOSIS — F325 Major depressive disorder, single episode, in full remission: Secondary | ICD-10-CM | POA: Diagnosis not present

## 2016-03-29 DIAGNOSIS — I10 Essential (primary) hypertension: Secondary | ICD-10-CM

## 2016-03-29 DIAGNOSIS — Z131 Encounter for screening for diabetes mellitus: Secondary | ICD-10-CM | POA: Diagnosis not present

## 2016-03-29 DIAGNOSIS — E038 Other specified hypothyroidism: Secondary | ICD-10-CM | POA: Insufficient documentation

## 2016-03-29 MED ORDER — VALSARTAN-HYDROCHLOROTHIAZIDE 160-12.5 MG PO TABS: 1.0000 | ORAL_TABLET | Freq: Every day | ORAL | 1 refills | Status: DC

## 2016-03-29 MED ORDER — BUPROPION HCL ER (XL) 300 MG PO TB24: 300.0000 mg | ORAL_TABLET | Freq: Every day | ORAL | 1 refills | Status: DC

## 2016-03-29 NOTE — Progress Notes (Signed)
Name: Shari SaleKaren S Long   MRN: [id number]    DOB: 06/27/1961   Date:03/29/2016       Progress Note  Subjective  Chief Complaint  Chief Complaint  Patient presents with  . Medication Refill  . Hypothyroidism    Takes 125 mcq daily and controls thyroid symptoms  . Hypertension  . Depression    Stable on medication    HPI  Hypothyroidism: she is compliant with medication, she states diagnosed with Hashimoto thyroiditis at age 55. She has dry skin ( chronic ) , no constipation, she denies change in energy level.   HTN: taking medication and denies side effects. She was training with a personal trainer in 2012 and developed palpitation, she was seen by Dr. Mariah MillingGollan and told that resting heart rate and was given Lopressor but states once she stopped personal training palpitation resolved and she stopped medication  Depression in Remission: she has been on Wellbutrin for many years, she has tried to quit medication but her mood gets worse, and feels sad. At this time she is feeling fine. No anhedonia, no crying spells.   GERD: seldom, takes prn medication. Currently no heartburn or regurgitation   Patient Active Problem List   Diagnosis Date Noted  . Hypothyroidism due to Hashimoto's thyroiditis 03/29/2016  . Major depression in remission (HCC) 03/29/2016  . GERD without esophagitis 03/29/2016  . Status post vaginal hysterectomy 11/24/2015  . Vaginal atrophy 11/24/2015  . Obesity (BMI 30.0-34.9) 11/24/2015  . HTN (hypertension) 12/25/2010    Past Surgical History:  Procedure Laterality Date  . CARDIAC CATHETERIZATION    . FOOT SURGERY  1978   left foot   . TONSILLECTOMY    . VAGINAL HYSTERECTOMY  2002    Family History  Problem Relation Age of Onset  . Cancer - Cervical Mother   . Diabetes Mother   . Breast cancer Paternal Aunt 48    great  . Breast cancer Paternal Uncle 868  . Colon cancer Paternal Uncle   . Breast cancer Paternal Grandmother [phone]  . Breast cancer Cousin      ovarian (50's), breast(50's) and uterine(60's) cancer on maternal side.   . Ovarian cancer Cousin   . Heart disease Maternal Grandmother   . Heart disease Paternal Grandfather     Social History   Social History  . Marital status: Married    Spouse name: N/A  . Number of children: N/A  . Years of education: N/A   Occupational History  . Not on file.   Social History Main Topics  . Smoking status: Never Smoker  . Smokeless tobacco: Never Used  . Alcohol use 0.5 oz/week    1 Standard drinks or equivalent per week  . Drug use: No  . Sexual activity: Yes    Birth control/ protection: Surgical   Other Topics Concern  . Not on file   Social History Narrative  . No narrative on file     Current Outpatient Prescriptions:  .  aspirin 81 MG tablet, Take 81 mg by mouth daily.  , Disp: , Rfl:  .  buPROPion (WELLBUTRIN XL) 300 MG 24 hr tablet, Take 1 tablet (300 mg total) by mouth daily., Disp: 90 tablet, Rfl: 1 .  calcium citrate-vitamin D 200-200 MG-UNIT TABS, Take 1 tablet by mouth daily.  , Disp: , Rfl:  .  estradiol (ESTRACE VAGINAL) 0.1 MG/GM vaginal cream, Place 1 Applicatorful vaginally 2 (two) times a week., Disp: 42.5 g, Rfl: 2 .  fluticasone (  FLONASE) 50 MCG/ACT nasal spray, Place 2 sprays into both nostrils daily., Disp: 16 g, Rfl: 6 .  levothyroxine (SYNTHROID, LEVOTHROID) 125 MCG tablet, Take 1 tablet (125 mcg total) by mouth daily., Disp: 10 tablet, Rfl: 0 .  Omega-3 Fatty Acids (FISH OIL) 1000 MG CAPS, Take by mouth.  , Disp: , Rfl:  .  ranitidine (ZANTAC) 150 MG tablet, Take 150 mg by mouth as needed for heartburn., Disp: , Rfl:  .  valsartan-hydrochlorothiazide (DIOVAN-HCT) 160-12.5 MG tablet, Take 1 tablet by mouth daily., Disp: 90 tablet, Rfl: 1 .  vitamin E 400 UNIT capsule, Take 400 Units by mouth daily.  , Disp: , Rfl:   Allergies  Allergen Reactions  . Penicillins Rash     ROS  Constitutional: Negative for fever or weight change.  Respiratory:  Negative for cough and shortness of breath.   Cardiovascular: Negative for chest pain or palpitations.  Gastrointestinal: Negative for abdominal pain, no bowel changes.  Musculoskeletal: Negative for gait problem or joint swelling.  Skin: Negative for rash.  Neurological: Negative for dizziness or headache.  No other specific complaints in a complete review of systems (except as listed in HPI above).  Objective  Vitals:   03/29/16 1120  BP: 116/74  Pulse: 93  Resp: 18  Temp: 98.3 F (36.8 C)  TempSrc: Oral  SpO2: 96%  Weight: 211 lb (95.7 kg)  Height: 5\' 4"  (1.626 m)    Body mass index is 36.22 kg/m.  Physical Exam  Constitutional: Patient appears well-developed and well-nourished. Obese  No distress.  HEENT: head atraumatic, normocephalic, pupils equal and reactive to light,  neck supple, throat within normal limits, normal thyroid exam Cardiovascular: Normal rate, regular rhythm and normal heart sounds.  No murmur heard. No BLE edema. Pulmonary/Chest: Effort normal and breath sounds normal. No respiratory distress. Abdominal: Soft.  There is no tenderness. Psychiatric: Patient has a normal mood and affect. behavior is normal. Judgment and thought content normal   PHQ2/9: Depression screen [facility] 2/9 03/29/2016 05/17/2015  Decreased Interest 0 0  Down, Depressed, Hopeless 0 0  PHQ - 2 Score 0 0    Fall Risk: Fall Risk  03/29/2016 05/17/2015  Falls in the past year? No No     Functional Status Survey: Is the patient deaf or have difficulty hearing?: No Does the patient have difficulty seeing, even when wearing glasses/contacts?: No Does the patient have difficulty concentrating, remembering, or making decisions?: No Does the patient have difficulty walking or climbing stairs?: No Does the patient have difficulty dressing or bathing?: No Does the patient have difficulty doing errands alone such as visiting a doctor's office or shopping?: No    Assessment &  Plan  1. Hypothyroidism due to Hashimoto's thyroiditis  - TSH  2. Essential hypertension  - valsartan-hydrochlorothiazide (DIOVAN-HCT) 160-12.5 MG tablet; Take 1 tablet by mouth daily.  Dispense: 90 tablet; Refill: 1 - COMPLETE METABOLIC PANEL WITH GFR  3. Need for hepatitis C screening test  - Hepatitis C antibody  4. Major depression in remission (HCC)  - buPROPion (WELLBUTRIN XL) 300 MG 24 hr tablet; Take 1 tablet (300 mg total) by mouth daily.  Dispense: 90 tablet; Refill: 1  5. GERD without esophagitis  Continue prn medication   6. Lipid screening  - Lipid panel  7. DM (diabetes mellitus screen)  - Hemoglobin A1c

## 2016-03-30 ENCOUNTER — Ambulatory Visit: Payer: 59

## 2016-03-30 LAB — COMPLETE METABOLIC PANEL WITH GFR
ALT: 19 U/L (ref 6–29)
AST: 25 U/L (ref 10–35)
Albumin: 4.1 g/dL (ref 3.6–5.1)
Alkaline Phosphatase: 96 U/L (ref 33–130)
BUN: 13 mg/dL (ref 7–25)
CHLORIDE: 101 mmol/L (ref 98–110)
CO2: 25 mmol/L (ref 20–31)
Calcium: 9.2 mg/dL (ref 8.6–10.4)
Creat: 0.94 mg/dL (ref 0.50–1.05)
GFR, EST AFRICAN AMERICAN: 78 mL/min (ref >=60)
GFR, EST NON AFRICAN AMERICAN: 68 mL/min (ref >=60)
Glucose, Bld: 94 mg/dL (ref 65–99)
Potassium: 3.8 mmol/L (ref 3.5–5.3)
SODIUM: 138 mmol/L (ref 135–146)
Total Bilirubin: 0.3 mg/dL (ref 0.2–1.2)
Total Protein: 6.6 g/dL (ref 6.1–8.1)

## 2016-03-30 LAB — TSH: TSH: 4.47 m[IU]/L

## 2016-03-30 LAB — HEMOGLOBIN A1C
HEMOGLOBIN A1C: 5.2 % (ref <5.7)
MEAN PLASMA GLUCOSE: 103 mg/dL

## 2016-03-30 LAB — HEPATITIS C ANTIBODY: HCV Ab: NEGATIVE

## 2016-03-30 LAB — LIPID PANEL
Cholesterol: 210 mg/dL — ABNORMAL HIGH (ref 125–200)
HDL: 52 mg/dL (ref >=46)
LDL CALC: 135 mg/dL — AB (ref <130)
TRIGLYCERIDES: 114 mg/dL (ref <150)
Total CHOL/HDL Ratio: 4 Ratio (ref <=5.0)
VLDL: 23 mg/dL (ref <30)

## 2016-04-01 ENCOUNTER — Other Ambulatory Visit: Payer: Self-pay | Admitting: Family Medicine

## 2016-04-01 MED ORDER — LEVOTHYROXINE SODIUM 125 MCG PO TABS
125.0000 ug | ORAL_TABLET | Freq: Every day | ORAL | 1 refills | Status: DC
Start: 2016-04-01 — End: 2016-10-02

## 2016-05-08 NOTE — Telephone Encounter (Signed)
ok 

## 2016-05-08 NOTE — Telephone Encounter (Signed)
Please schedule new patient appt. Thanks!  

## 2016-05-08 NOTE — Telephone Encounter (Signed)
Dr Reece AgarG I don't know if you will remember him.  He has not been seen since 09/2014 and the last few visits were just acute ones. Michelle NasutiElena

## 2016-05-08 NOTE — Telephone Encounter (Signed)
They were actually by Maurine Ministerennis and Nadine CountsBob, you had not seen him since 2013 E

## 2016-05-14 NOTE — Telephone Encounter (Signed)
Spoke with pt and advised. Pt stated that she needed that appt when she called in August and she was able to get seen in another office. Thanks TNP

## 2016-06-02 ENCOUNTER — Encounter: Payer: Self-pay | Admitting: Family Medicine

## 2016-06-04 ENCOUNTER — Other Ambulatory Visit: Payer: Self-pay | Admitting: Family Medicine

## 2016-06-04 DIAGNOSIS — E063 Autoimmune thyroiditis: Principal | ICD-10-CM

## 2016-06-04 DIAGNOSIS — E038 Other specified hypothyroidism: Secondary | ICD-10-CM

## 2016-06-13 ENCOUNTER — Encounter: Payer: Self-pay | Admitting: Physician Assistant

## 2016-06-13 ENCOUNTER — Ambulatory Visit: Payer: Self-pay | Admitting: Physician Assistant

## 2016-06-13 VITALS — BP 120/84 | HR 84 | Temp 98.6°F

## 2016-06-13 DIAGNOSIS — J069 Acute upper respiratory infection, unspecified: Secondary | ICD-10-CM

## 2016-06-13 MED ORDER — AZITHROMYCIN 250 MG PO TABS: ORAL_TABLET | ORAL | 0 refills | Status: DC

## 2016-06-13 MED ORDER — ALBUTEROL SULFATE HFA 108 (90 BASE) MCG/ACT IN AERS
2.0000 | INHALATION_SPRAY | Freq: Four times a day (QID) | RESPIRATORY_TRACT | 0 refills | Status: DC | PRN
Start: 2016-06-13 — End: 2016-10-02

## 2016-06-13 NOTE — Progress Notes (Addendum)
S: C/o cough and congestion with occational wheezing, sx for 2 weeks, denies fever, chills, mucus is green, but mostly cough is dry and hacking; keeping pt awake at night;  denies cardiac type chest pain or sob, v/d, abd pain Remainder ros neg  O: vitals wnl, nad, tms clear, throat injected, neck supple no lymph, lungs c t a, cv rrr, neuro intact  A:  Acute bronchitis   P:  rx medication: zpack, albuterol inhaler;  use otc meds, tylenol or motrin as needed for fever/chills, return if not better in 3 -5 days, return earlier if worsening  Called in prilosec for pt

## 2016-06-18 MED ORDER — OMEPRAZOLE 20 MG PO CPDR: 20.0000 mg | DELAYED_RELEASE_CAPSULE | Freq: Every day | ORAL | 3 refills | Status: DC

## 2016-06-18 NOTE — Addendum Note (Signed)
Addended by: Faythe GheeFISHER, SUSAN W on: 06/18/2016 10:31 AM   Modules accepted: Orders

## 2016-06-29 ENCOUNTER — Encounter: Payer: Self-pay | Admitting: Physician Assistant

## 2016-06-29 ENCOUNTER — Ambulatory Visit: Payer: Self-pay | Admitting: Physician Assistant

## 2016-06-29 VITALS — BP 124/86 | HR 80 | Temp 97.6°F

## 2016-06-29 DIAGNOSIS — H6500 Acute serous otitis media, unspecified ear: Secondary | ICD-10-CM

## 2016-06-29 MED ORDER — CEFDINIR 300 MG PO CAPS: 300.0000 mg | ORAL_CAPSULE | Freq: Two times a day (BID) | ORAL | 0 refills | Status: DC

## 2016-06-29 MED ORDER — PREDNISONE 10 MG PO TABS: 30.0000 mg | ORAL_TABLET | Freq: Every day | ORAL | 0 refills | Status: DC

## 2016-06-29 MED ORDER — FLUCONAZOLE 150 MG PO TABS: ORAL_TABLET | ORAL | 0 refills | Status: DC

## 2016-06-29 NOTE — Progress Notes (Signed)
S: c/o continued ear pain, has gotten worse and is having popping, states the cough and bronchitis got better with the zpack but now her ears hurt, no drainage from ears, no fever/chills, no cough  O: vitals wnl, nad, both tms are pink, left has fluid bubbles, throat wnl, neck supple no lymph, lungs c t a, cv rrr  A: acute otitis media  P: omnicef, diflucan, pred 30mg  qd x 3d,

## 2016-10-02 ENCOUNTER — Ambulatory Visit (INDEPENDENT_AMBULATORY_CARE_PROVIDER_SITE_OTHER): Payer: 59 | Admitting: Family Medicine

## 2016-10-02 ENCOUNTER — Encounter: Payer: Self-pay | Admitting: Family Medicine

## 2016-10-02 VITALS — BP 124/68 | HR 104 | Temp 98.2°F | Resp 18 | Ht 63.5 in | Wt 202.3 lb

## 2016-10-02 DIAGNOSIS — E063 Autoimmune thyroiditis: Secondary | ICD-10-CM | POA: Diagnosis not present

## 2016-10-02 DIAGNOSIS — F325 Major depressive disorder, single episode, in full remission: Secondary | ICD-10-CM

## 2016-10-02 DIAGNOSIS — E785 Hyperlipidemia, unspecified: Secondary | ICD-10-CM | POA: Diagnosis not present

## 2016-10-02 DIAGNOSIS — I10 Essential (primary) hypertension: Secondary | ICD-10-CM

## 2016-10-02 DIAGNOSIS — E038 Other specified hypothyroidism: Secondary | ICD-10-CM

## 2016-10-02 DIAGNOSIS — E669 Obesity, unspecified: Secondary | ICD-10-CM | POA: Diagnosis not present

## 2016-10-02 DIAGNOSIS — Z01419 Encounter for gynecological examination (general) (routine) without abnormal findings: Secondary | ICD-10-CM

## 2016-10-02 DIAGNOSIS — Z0000 Encounter for general adult medical examination without abnormal findings: Secondary | ICD-10-CM

## 2016-10-02 LAB — TSH: TSH: 0.03 m[IU]/L — AB

## 2016-10-02 MED ORDER — LEVOTHYROXINE SODIUM 125 MCG PO TABS: 125.0000 ug | ORAL_TABLET | Freq: Every day | ORAL | 1 refills | Status: DC

## 2016-10-02 MED ORDER — BUPROPION HCL ER (XL) 300 MG PO TB24: 300.0000 mg | ORAL_TABLET | Freq: Every day | ORAL | 1 refills | Status: DC

## 2016-10-02 MED ORDER — VALSARTAN-HYDROCHLOROTHIAZIDE 160-12.5 MG PO TABS: 1.0000 | ORAL_TABLET | Freq: Every day | ORAL | 1 refills | Status: DC

## 2016-10-02 NOTE — Progress Notes (Signed)
Name: Shari Long   MRN: [id number]    DOB: 1961/08/10   Date:10/02/2016       Progress Note  Subjective  Chief Complaint  Chief Complaint  Patient presents with  . Annual Exam    HPI  Well Woman: she states her BRAC was negative, positive of father's side of the family. She sees Dr. Greggory KeeneFrancesco. She is up to date with mammogram. Colonoscopy is up to date. She has pap smear done by Dr. Greggory KeeneFrancesco.   Dyslipidemia: she has changed her diet and has lost weight, we will recheck in August 2017  Obesity: she stopped drinking diet sodas and eating mostly a plant based diet 3 weeks ago. She is feeling more energetic. Discussed making sure she gets enough protein in her diet. Advised Myfitness[employer]   GERD: symptoms resolved when she stopped drinking diet drinks.   HTN: she is taking medication and denies side effects, no chest pain, palpitation.    Patient Active Problem List   Diagnosis Date Noted  . Dyslipidemia 10/02/2016  . Hypothyroidism due to Hashimoto's thyroiditis 03/29/2016  . Major depression in remission (HCC) 03/29/2016  . GERD without esophagitis 03/29/2016  . Status post vaginal hysterectomy 11/24/2015  . Vaginal atrophy 11/24/2015  . Obesity (BMI 30.0-34.9) 11/24/2015  . HTN (hypertension) 12/25/2010    Past Surgical History:  Procedure Laterality Date  . CARDIAC CATHETERIZATION    . FOOT SURGERY  1978   left foot   . TONSILLECTOMY    . VAGINAL HYSTERECTOMY  2002    Family History  Problem Relation Age of Onset  . Cancer - Cervical Mother   . Diabetes Mother   . Thyroid disease Sister   . Breast cancer Paternal Aunt 48    great  . Breast cancer Paternal Uncle 2568  . Colon cancer Paternal Uncle   . Breast cancer Paternal Grandmother 159  . Breast cancer Cousin     ovarian (50's), breast(50's) and uterine(60's) cancer on maternal side.   . Ovarian cancer Cousin   . Heart disease Maternal Grandmother   . Heart disease Paternal Grandfather     Social  History   Social History  . Marital status: Married    Spouse name: Brett CanalesSteve  . Number of children: 2  . Years of education: N/A   Occupational History  . 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 Smoking status: Never Smoker  . Smokeless tobacco: Never Used  . Alcohol use 0.5 oz/week    1 Standard drinks or equivalent per week  . Drug use: No  . Sexual activity: Yes    Partners: Male    Birth control/ protection: Surgical   Other Topics Concern  . Not on file   Social History Narrative   She was separated from husband in 2005 but reconciled .    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      Current Outpatient Prescriptions:  .  aspirin 81 MG tablet, Take 81 mg by mouth daily.  , Disp: , Rfl:  .  buPROPion (WELLBUTRIN XL) 300 MG 24 hr tablet, Take 1 tablet (300 mg total) by mouth daily., Disp: 90 tablet, Rfl: 1 .  calcium citrate-vitamin D 200-200 MG-UNIT TABS, Take 1 tablet by mouth daily.  , Disp: , Rfl:  .  fluticasone (FLONASE) 50 MCG/ACT nasal spray, Place 2 sprays into both nostrils daily., Disp: 16 g, Rfl: 6 .  levothyroxine (SYNTHROID, LEVOTHROID) 125 MCG tablet, Take 1 tablet (125 mcg total) by mouth daily. 1 daily during the week and 1.5 on Sunday, Disp: 100 tablet, Rfl: 1 .  Omega-3 Fatty Acids (FISH OIL) 1000 MG CAPS, Take by mouth.  , Disp: , Rfl:  .  valsartan-hydrochlorothiazide (DIOVAN-HCT) 160-12.5 MG tablet, Take 1 tablet by mouth daily., Disp: 90 tablet, Rfl: 1 .  vitamin E 400 UNIT capsule, Take 400 Units by mouth daily.  , Disp: , Rfl:   Allergies  Allergen Reactions  . Penicillins Rash     ROS  Constitutional: Negative for fever, positive for  weight change.  Respiratory: Negative for cough and shortness of breath.   Cardiovascular: Negative for chest pain or palpitations.  Gastrointestinal: Negative for abdominal pain, no bowel changes.  Musculoskeletal: Negative for gait problem or joint  swelling.  Skin: Negative for rash.  Neurological: Negative for dizziness or headache.  No other specific complaints in a complete review of systems (except as listed in HPI above).  Objective  Vitals:   10/02/16 0823  BP: 124/68  Pulse: (!) 104  Resp: 18  Temp: 98.2 F (36.8 C)  TempSrc: Oral  SpO2: 98%  Weight: 202 lb 4.8 oz (91.8 kg)  Height: 5' 3.5" (1.613 m)    Body mass index is 35.27 kg/m.  Physical Exam  Constitutional: Patient appears well-developed and obese. No distress.  HENT: Head: Normocephalic and atraumatic. Ears: B TMs ok, no erythema or effusion; Nose: Nose normal. Mouth/Throat: Oropharynx is clear and moist. No oropharyngeal exudate.  Eyes: Conjunctivae and EOM are normal. Pupils are equal, round, and reactive to light. No scleral icterus.  Neck: Normal range of motion. Neck supple. No JVD present. No thyromegaly present.  Cardiovascular: Normal rate, regular rhythm and normal heart sounds.  No murmur heard. No BLE edema. Pulmonary/Chest: Effort normal and breath sounds normal. No respiratory distress. Abdominal: Soft. Bowel sounds are normal, no distension. There is no tenderness. no masses Breast: not done FEMALE GENITALIA:  Not done RECTAL: not done Musculoskeletal: Normal range of motion, no joint effusions. No gross deformities Neurological: he is alert and oriented to person, place, and time. No cranial nerve deficit. Coordination, balance, strength, speech and gait are normal.  Skin: Skin is warm and dry. No rash noted. No erythema.  Psychiatric: Patient has a normal mood and affect. behavior is normal. Judgment and thought content normal.  PHQ2/9: Depression screen [facility] 2/9 10/02/2016 03/29/2016 05/17/2015  Decreased Interest 0 0 0  Down, Depressed, Hopeless 0 0 0  PHQ - 2 Score 0 0 0     Fall Risk: Fall Risk  10/02/2016 03/29/2016 05/17/2015  Falls in the past year? No No No     Functional Status Survey: Is the patient deaf or have  difficulty hearing?: No Does the patient have difficulty seeing, even when wearing glasses/contacts?: No Does the patient have difficulty concentrating, remembering, or making decisions?: No Does the patient have difficulty walking or climbing stairs?: No Does the patient have difficulty dressing or bathing?: No Does the patient have difficulty doing errands alone such as visiting a doctor's office or shopping?: No    Assessment & Plan  1. Well woman exam  Discussed importance of 150 minutes of physical activity weekly, eat two servings of fish weekly, eat one serving of tree nuts ( cashews, pistachios, pecans, almonds.Marland Kitchen) every other day, eat 6 servings of fruit/vegetables daily and drink plenty of water and avoid sweet beverages.   2. Hypothyroidism due  to Hashimoto's thyroiditis  - levothyroxine (SYNTHROID, LEVOTHROID) 125 MCG tablet; Take 1 tablet (125 mcg total) by mouth daily. 1 daily during the week and 1.5 on Sunday  Dispense: 100 tablet; Refill: 1 - TSH  3. Essential hypertension  - valsartan-hydrochlorothiazide (DIOVAN-HCT) 160-12.5 MG tablet; Take 1 tablet by mouth daily.  Dispense: 90 tablet; Refill: 1  4. Major depression in remission (HCC)  - buPROPion (WELLBUTRIN XL) 300 MG 24 hr tablet; Take 1 tablet (300 mg total) by mouth daily.  Dispense: 90 tablet; Refill: 1  5. Dyslipidemia  She has changed her life style, eating mostly a plant diet and we will recheck next visit   6. Obesity (BMI 30-39.9)  Discussed with the patient the risk posed by an increased BMI. Discussed importance of portion control, calorie counting and at least 150 minutes of physical activity weekly. Avoid sweet beverages and drink more water. Eat at least 6 servings of fruit and vegetables daily

## 2016-10-02 NOTE — Patient Instructions (Signed)
[employer] Pal

## 2016-10-03 ENCOUNTER — Encounter: Payer: Self-pay | Admitting: Family Medicine

## 2016-10-03 ENCOUNTER — Other Ambulatory Visit: Payer: Self-pay

## 2016-10-03 ENCOUNTER — Other Ambulatory Visit: Payer: Self-pay | Admitting: Family Medicine

## 2016-10-03 DIAGNOSIS — E063 Autoimmune thyroiditis: Principal | ICD-10-CM

## 2016-10-03 DIAGNOSIS — E038 Other specified hypothyroidism: Secondary | ICD-10-CM

## 2016-10-03 MED ORDER — LEVOTHYROXINE SODIUM 125 MCG PO TABS: 125.0000 ug | ORAL_TABLET | Freq: Every day | ORAL | 1 refills | Status: DC

## 2016-10-03 MED ORDER — LEVOTHYROXINE SODIUM 112 MCG PO TABS: 112.0000 ug | ORAL_TABLET | Freq: Every day | ORAL | 1 refills | Status: DC

## 2016-10-03 NOTE — Progress Notes (Unsigned)
Patient was informed but states since having prior experience with Synthroid 112 mcg in the past and not doing well with this dosage. That Dr. Carlynn PurlSowles please consider her staying on Synthroid 125 mcg. Patient states with this lower dosage she sleeps all the time and is very fatigue. 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 With this past dosage she was doing one a day and then 1 and a half on Sunday, she will stop the half dose and recheck blood work April 4. Thanks

## 2016-11-27 ENCOUNTER — Encounter: Payer: 59 | Admitting: Obstetrics and Gynecology

## 2016-11-29 NOTE — Progress Notes (Signed)
GYN ANNUAL PREVENTATIVE CARE ENCOUNTER NOTE  Subjective:       Shari Long is a 56 y.o. [id number] female here for a routine annual gynecologic exam.  Current complaints: None.    Status post TVH. Weight is down 18#.Marland Kitchen Gynecologic History No LMP recorded. Patient has had a hysterectomy. Contraception: status post hysterectomy Last Pap: Normal Last mammogram: 03/23/2016 birad 1  Obstetric History OB History  Gravida Para Term Preterm AB Living  SAB TAB Ectopic Multiple Live Births               # Outcome Date GA Lbr Len/2nd Weight Sex Delivery Anes PTL Lv  2 Term      Vag-Spont     1 Term      Vag-Spont         Past Medical History:  Diagnosis Date  . DDD (degenerative disc disease), lumbar   . Degenerative disc disease   . Fatigue   . Hypertension   . Increased BMI   . Menopause   . Seasonal allergies   . Thyroid disease    hypothyroidism  . Vaginal atrophy     Past Surgical History:  Procedure Laterality Date  . CARDIAC CATHETERIZATION    . FOOT SURGERY  1978   left foot   . TONSILLECTOMY    . VAGINAL HYSTERECTOMY  2002    Current Outpatient Prescriptions on File Prior to Visit  Medication Sig Dispense Refill  . aspirin 81 MG tablet Take 81 mg by mouth daily.      Marland Kitchen buPROPion (WELLBUTRIN XL) 300 MG 24 hr tablet Take 1 tablet (300 mg total) by mouth daily. 90 tablet 1  . calcium citrate-vitamin D 200-200 MG-UNIT TABS Take 1 tablet by mouth daily.      . fluticasone (FLONASE) 50 MCG/ACT nasal spray Place 2 sprays into both nostrils daily. 16 g 6  . levothyroxine (SYNTHROID, LEVOTHROID) 125 MCG tablet Take 1 tablet (125 mcg total) by mouth daily. 30 tablet 1  . Omega-3 Fatty Acids (FISH OIL) 1000 MG CAPS Take by mouth.      . valsartan-hydrochlorothiazide (DIOVAN-HCT) 160-12.5 MG tablet Take 1 tablet by mouth daily. 90 tablet 1  . vitamin E 400 UNIT capsule Take 400 Units by mouth daily.       No current facility-administered medications on file  prior to visit.     Allergies  Allergen Reactions  . Penicillins Rash    Social History   Social History  . Marital status: Married    Spouse name: Brett Canales  . Number of children: 2  . Years of education: N/A   Occupational History  . 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 Smoking status: Never Smoker  . Smokeless tobacco: Never Used  . Alcohol use 0.5 oz/week    1 Standard drinks or equivalent per week  . Drug use: No  . Sexual activity: Yes    Partners: Male    Birth control/ protection: Surgical   Other Topics Concern  . Not on file   Social History Narrative   She was separated from husband in 2005 but reconciled .    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     Family History  Problem Relation Age of Onset  . Cancer - Cervical Mother   . Diabetes Mother   . Thyroid  disease Sister   . Breast cancer Paternal Aunt 48    great  . Breast cancer Paternal Uncle 16  . Colon cancer Paternal Uncle   . Breast cancer Paternal Grandmother 83  . Breast cancer Cousin     ovarian (50's), breast(50's) and uterine(60's) cancer on maternal side.   . Ovarian cancer Cousin   . Heart disease Maternal Grandmother   . Heart disease Paternal Grandfather     The following portions of the patient's history were reviewed and updated as appropriate: allergies, current medications, past family history, past medical history, past social history, past surgical history and problem list.  Review of Systems Review of Systems  Constitutional: Negative.        Occasional night sweats  Eyes: Negative.   Respiratory: Negative.   Cardiovascular: Negative.   Gastrointestinal: Negative.   Genitourinary: Negative for dysuria and urgency.       Stress urinary incontinence symptoms; wears pads on occasion Vaginal dryness; Uses lubricants  Musculoskeletal: Negative.   Skin: Negative.   Neurological: Negative.   Endo/Heme/Allergies:  Negative.   Psychiatric/Behavioral: Negative.      Objective:  BP 100/66   Pulse 81   Ht 5' 3.5" (1.613 m)   Wt 191 lb 12.8 oz (87 kg)   BMI 33.44 kg/m   Physical Exam  Constitutional: She is oriented to person, place, and time. She appears well-developed and well-nourished.  HENT:  Head: Normocephalic and atraumatic.  Eyes: Conjunctivae and EOM are normal.  Neck: Neck supple. No thyromegaly present.  Cardiovascular: Normal rate, regular rhythm and normal heart sounds.   Pulmonary/Chest: Effort normal and breath sounds normal.  Abdominal: Soft. She exhibits no distension and no mass. There is no tenderness. No hernia.  Genitourinary:  Genitourinary Comments: External genitalia-normal BUS-normal Vagina-moderate atrophy; good vault support Cervix-surgically absent Uterus-surgically absent Adnexa-nonpalpable and on tender Rectovaginal-normal external exam; normal sphincter tone; no rectal masses  Musculoskeletal: Normal range of motion. She exhibits no edema or tenderness.  Lymphadenopathy:    She has no cervical adenopathy.  Neurological: She is alert and oriented to person, place, and time.  Skin: Skin is warm and dry.  Psychiatric: She has a normal mood and affect. Her behavior is normal.    Assessment:   Annual gynecologic examination 56 y.o. Contraception: status post hysterectomy BMI- 33 Patient Active Problem List   Diagnosis Date Noted  . Dyslipidemia 10/02/2016  . Obesity (BMI 30-39.9) 10/02/2016  . Hypothyroidism due to Hashimoto's thyroiditis 03/29/2016  . Major depression in remission (HCC) 03/29/2016  . GERD without esophagitis 03/29/2016  . Status post vaginal hysterectomy 11/24/2015  . Vaginal atrophy 11/24/2015  . Obesity (BMI 30.0-34.9) 11/24/2015  . HTN (hypertension) 12/25/2010     Plan:  Pap:No further paps needed Mammogram: Ordered  Stool Cards:Ordered Labs: Through PCP Routine preventative health maintenance measures emphasized:  Diet/Weight control, Tobacco Cessation and Alcohol/Drug use Return to Clinic - 1 Year Lubricants recommended: Astroglide, Jo H2O lubricant, Virgin olive oil, Coconut oil, [place]arosa suppositories, estrogen vaginal creams   Darol Destine, CMA  Herold Harms, MD   Note: This dictation was prepared with Dragon dictation along with smaller phrase technology. Any transcriptional errors that result from this process are unintentional.

## 2016-12-05 ENCOUNTER — Encounter: Payer: Self-pay | Admitting: Obstetrics and Gynecology

## 2016-12-05 ENCOUNTER — Ambulatory Visit (INDEPENDENT_AMBULATORY_CARE_PROVIDER_SITE_OTHER): Payer: 59 | Admitting: Obstetrics and Gynecology

## 2016-12-05 ENCOUNTER — Encounter: Payer: 59 | Admitting: Obstetrics and Gynecology

## 2016-12-05 VITALS — BP 100/66 | HR 81 | Ht 63.5 in | Wt 191.8 lb

## 2016-12-05 DIAGNOSIS — Z780 Asymptomatic menopausal state: Secondary | ICD-10-CM | POA: Diagnosis not present

## 2016-12-05 DIAGNOSIS — Z90710 Acquired absence of both cervix and uterus: Secondary | ICD-10-CM | POA: Diagnosis not present

## 2016-12-05 DIAGNOSIS — E669 Obesity, unspecified: Secondary | ICD-10-CM

## 2016-12-05 DIAGNOSIS — N952 Postmenopausal atrophic vaginitis: Secondary | ICD-10-CM

## 2016-12-05 DIAGNOSIS — Z1231 Encounter for screening mammogram for malignant neoplasm of breast: Secondary | ICD-10-CM

## 2016-12-05 DIAGNOSIS — Z01419 Encounter for gynecological examination (general) (routine) without abnormal findings: Secondary | ICD-10-CM | POA: Diagnosis not present

## 2016-12-05 DIAGNOSIS — Z1211 Encounter for screening for malignant neoplasm of colon: Secondary | ICD-10-CM

## 2016-12-05 DIAGNOSIS — Z1239 Encounter for other screening for malignant neoplasm of breast: Secondary | ICD-10-CM

## 2016-12-05 NOTE — Patient Instructions (Signed)
1. No Pap smear is needed 2. Mammogram is ordered 3. Stool guaiac cards are given for colon cancer screening 4. Screening labs are to be obtained through primary care 5. Continue with calcium and vitamin D supplementation 6. Continue with healthy eating, exercise, and control weight loss 7. Physical therapy consultation will be considered for stress urinary incontinence. Patient will contact us if desired. 8. Lubricants for use in the vagina for dryness:  Astroglide  Jo H2O lubricant  Virgin olive oil  Coconut oil  Hyaluronic acid vaginal cream  Intrarosa suppositories  Premarin or Estrace vaginal cream 9. Return in 1 year for annual exam   Health Maintenance for Postmenopausal Women Menopause is a normal process in which your reproductive ability comes to an end. This process happens gradually over a span of months to years, usually between the ages of 30 and 97. Menopause is complete when you have missed 12 consecutive menstrual periods. It is important to talk with your health care provider about some of the most common conditions that affect postmenopausal women, such as heart disease, cancer, and bone loss (osteoporosis). Adopting a healthy lifestyle and getting preventive care can help to promote your health and wellness. Those actions can also lower your chances of developing some of these common conditions. What should I know about menopause? During menopause, you may experience a number of symptoms, such as:  Moderate-to-severe hot flashes.  Night sweats.  Decrease in sex drive.  Mood swings.  Headaches.  Tiredness.  Irritability.  Memory problems.  Insomnia. Choosing to treat or not to treat menopausal changes is an individual decision that you make with your health care provider. What should I know about hormone replacement therapy and supplements? Hormone therapy products are effective for treating symptoms that are associated with menopause, such as hot flashes  and night sweats. Hormone replacement carries certain risks, especially as you become older. If you are thinking about using estrogen or estrogen with progestin treatments, discuss the benefits and risks with your health care provider. What should I know about heart disease and stroke? Heart disease, heart attack, and stroke become more likely as you age. This may be due, in part, to the hormonal changes that your body experiences during menopause. These can affect how your body processes dietary fats, triglycerides, and cholesterol. Heart attack and stroke are both medical emergencies. There are many things that you can do to help prevent heart disease and stroke:  Have your blood pressure checked at least every 1-2 years. High blood pressure causes heart disease and increases the risk of stroke.  If you are 56-41 years old, ask your health care provider if you should take aspirin to prevent a heart attack or a stroke.  Do not use any tobacco products, including cigarettes, chewing tobacco, or electronic cigarettes. If you need help quitting, ask your health care provider.  It is important to eat a healthy diet and maintain a healthy weight.  Be sure to include plenty of vegetables, fruits, low-fat dairy products, and lean protein.  Avoid eating foods that are high in solid fats, added sugars, or salt (sodium).  Get regular exercise. This is one of the most important things that you can do for your health.  Try to exercise for at least 150 minutes each week. The type of exercise that you do should increase your heart rate and make you sweat. This is known as moderate-intensity exercise.  Try to do strengthening exercises at least twice each week. Do these  in addition to the moderate-intensity exercise.  Know your numbers.Ask your health care provider to check your cholesterol and your blood glucose. Continue to have your blood tested as directed by your health care provider. What should I  know about cancer screening? There are several types of cancer. Take the following steps to reduce your risk and to catch any cancer development as early as possible. Breast Cancer  Practice breast self-awareness.  This means understanding how your breasts normally appear and feel.  It also means doing regular breast self-exams. Let your health care provider know about any changes, no matter how small.  If you are 33 or older, have a clinician do a breast exam (clinical breast exam or CBE) every year. Depending on your age, family history, and medical history, it may be recommended that you also have a yearly breast X-ray (mammogram).  If you have a family history of breast cancer, talk with your health care provider about genetic screening.  If you are at high risk for breast cancer, talk with your health care provider about having an MRI and a mammogram every year.  Breast cancer (BRCA) gene test is recommended for women who have family members with BRCA-related cancers. Results of the assessment will determine the need for genetic counseling and BRCA1 and for BRCA2 testing. BRCA-related cancers include these types:  Breast. This occurs in males or females.  Ovarian.  Tubal. This may also be called fallopian tube cancer.  Cancer of the abdominal or pelvic lining (peritoneal cancer).  Prostate.  Pancreatic. Cervical, Uterine, and Ovarian Cancer  Your health care provider may recommend that you be screened regularly for cancer of the pelvic organs. These include your ovaries, uterus, and vagina. This screening involves a pelvic exam, which includes checking for microscopic changes to the surface of your cervix (Pap test).  For women ages 21-65, health care providers may recommend a pelvic exam and a Pap test every three years. For women ages 46-65, they may recommend the Pap test and pelvic exam, combined with testing for human papilloma virus (HPV), every five years. Some types of  HPV increase your risk of cervical cancer. Testing for HPV may also be done on women of any age who have unclear Pap test results.  Other health care providers may not recommend any screening for nonpregnant women who are considered low risk for pelvic cancer and have no symptoms. Ask your health care provider if a screening pelvic exam is right for you.  If you have had past treatment for cervical cancer or a condition that could lead to cancer, you need Pap tests and screening for cancer for at least 20 years after your treatment. If Pap tests have been discontinued for you, your risk factors (such as having a new sexual partner) need to be reassessed to determine if you should start having screenings again. Some women have medical problems that increase the chance of getting cervical cancer. In these cases, your health care provider may recommend that you have screening and Pap tests more often.  If you have a family history of uterine cancer or ovarian cancer, talk with your health care provider about genetic screening.  If you have vaginal bleeding after reaching menopause, tell your health care provider.  There are currently no reliable tests available to screen for ovarian cancer. Lung Cancer  Lung cancer screening is recommended for adults 47-77 years old who are at high risk for lung cancer because of a history of smoking. A  yearly low-dose CT scan of the lungs is recommended if you:  Currently smoke.  Have a history of at least 30 pack-years of smoking and you currently smoke or have quit within the past 15 years. A pack-year is smoking an average of one pack of cigarettes per day for one year. Yearly screening should:  Continue until it has been 15 years since you quit.  Stop if you develop a health problem that would prevent you from having lung cancer treatment. Colorectal Cancer  This type of cancer can be detected and can often be prevented.  Routine colorectal cancer  screening usually begins at age 53 and continues through age 62.  If you have risk factors for colon cancer, your health care provider may recommend that you be screened at an earlier age.  If you have a family history of colorectal cancer, talk with your health care provider about genetic screening.  Your health care provider may also recommend using home test kits to check for hidden blood in your stool.  A small camera at the end of a tube can be used to examine your colon directly (sigmoidoscopy or colonoscopy). This is done to check for the earliest forms of colorectal cancer.  Direct examination of the colon should be repeated every 5-10 years until age 18. However, if early forms of precancerous polyps or small growths are found or if you have a family history or genetic risk for colorectal cancer, you may need to be screened more often. Skin Cancer  Check your skin from head to toe regularly.  Monitor any moles. Be sure to tell your health care provider:  About any new moles or changes in moles, especially if there is a change in a mole's shape or color.  If you have a mole that is larger than the size of a pencil eraser.  If any of your family members has a history of skin cancer, especially at a young age, talk with your health care provider about genetic screening.  Always use sunscreen. Apply sunscreen liberally and repeatedly throughout the day.  Whenever you are outside, protect yourself by wearing long sleeves, pants, a wide-brimmed hat, and sunglasses. What should I know about osteoporosis? Osteoporosis is a condition in which bone destruction happens more quickly than new bone creation. After menopause, you may be at an increased risk for osteoporosis. To help prevent osteoporosis or the bone fractures that can happen because of osteoporosis, the following is recommended:  If you are 29-67 years old, get at least 1,000 mg of calcium and at least 600 mg of vitamin D per  day.  If you are older than age 59 but younger than age 25, get at least 1,200 mg of calcium and at least 600 mg of vitamin D per day.  If you are older than age 43, get at least 1,200 mg of calcium and at least 800 mg of vitamin D per day. Smoking and excessive alcohol intake increase the risk of osteoporosis. Eat foods that are rich in calcium and vitamin D, and do weight-bearing exercises several times each week as directed by your health care provider. What should I know about how menopause affects my mental health? Depression may occur at any age, but it is more common as you become older. Common symptoms of depression include:  Low or sad mood.  Changes in sleep patterns.  Changes in appetite or eating patterns.  Feeling an overall lack of motivation or enjoyment of activities that you  previously enjoyed.  Frequent crying spells. Talk with your health care provider if you think that you are experiencing depression. What should I know about immunizations? It is important that you get and maintain your immunizations. These include:  Tetanus, diphtheria, and pertussis (Tdap) booster vaccine.  Influenza every year before the flu season begins.  Pneumonia vaccine.  Shingles vaccine. Your health care provider may also recommend other immunizations. This information is not intended to replace advice given to you by your health care provider. Make sure you discuss any questions you have with your health care provider. Document Released: 09/21/2005 Document Revised: 02/17/2016 Document Reviewed: 05/03/2015 Elsevier Interactive Patient Education  2017 [employer].

## 2017-04-01 ENCOUNTER — Encounter: Payer: Self-pay | Admitting: Family Medicine

## 2017-04-01 ENCOUNTER — Ambulatory Visit (INDEPENDENT_AMBULATORY_CARE_PROVIDER_SITE_OTHER): Payer: 59 | Admitting: Family Medicine

## 2017-04-01 VITALS — BP 104/68 | HR 78 | Temp 98.0°F | Resp 16 | Ht 64.0 in | Wt 204.0 lb

## 2017-04-01 DIAGNOSIS — E669 Obesity, unspecified: Secondary | ICD-10-CM

## 2017-04-01 DIAGNOSIS — R5383 Other fatigue: Secondary | ICD-10-CM | POA: Diagnosis not present

## 2017-04-01 DIAGNOSIS — K219 Gastro-esophageal reflux disease without esophagitis: Secondary | ICD-10-CM | POA: Diagnosis not present

## 2017-04-01 DIAGNOSIS — F325 Major depressive disorder, single episode, in full remission: Secondary | ICD-10-CM

## 2017-04-01 DIAGNOSIS — E785 Hyperlipidemia, unspecified: Secondary | ICD-10-CM

## 2017-04-01 DIAGNOSIS — I10 Essential (primary) hypertension: Secondary | ICD-10-CM | POA: Diagnosis not present

## 2017-04-01 DIAGNOSIS — Z131 Encounter for screening for diabetes mellitus: Secondary | ICD-10-CM | POA: Diagnosis not present

## 2017-04-01 DIAGNOSIS — G4733 Obstructive sleep apnea (adult) (pediatric): Secondary | ICD-10-CM

## 2017-04-01 DIAGNOSIS — E063 Autoimmune thyroiditis: Secondary | ICD-10-CM

## 2017-04-01 DIAGNOSIS — E038 Other specified hypothyroidism: Secondary | ICD-10-CM | POA: Diagnosis not present

## 2017-04-01 LAB — CBC WITH DIFFERENTIAL/PLATELET
BASOS PCT: 1 %
Basophils Absolute: 56 cells/uL (ref 0–200)
EOS ABS: 224 {cells}/uL (ref 15–500)
EOS PCT: 4 %
HCT: 39.8 % (ref 35.0–45.0)
Hemoglobin: 12.7 g/dL (ref 11.7–15.5)
LYMPHS PCT: 33 %
Lymphs Abs: 1848 cells/uL (ref 850–3900)
MCH: 28.7 pg (ref 27.0–33.0)
MCHC: 31.9 g/dL — ABNORMAL LOW (ref 32.0–36.0)
MCV: 89.8 fL (ref 80.0–100.0)
MONOS PCT: 7 %
MPV: 8.6 fL (ref 7.5–12.5)
Monocytes Absolute: 392 cells/uL (ref 200–950)
Neutro Abs: 3080 cells/uL (ref 1500–7800)
Neutrophils Relative %: 55 %
PLATELETS: 263 10*3/uL (ref 140–400)
RBC: 4.43 MIL/uL (ref 3.80–5.10)
RDW: 13.2 % (ref 11.0–15.0)
WBC: 5.6 10*3/uL (ref 3.8–10.8)

## 2017-04-01 MED ORDER — BUPROPION HCL ER (XL) 300 MG PO TB24: 300.0000 mg | ORAL_TABLET | Freq: Every day | ORAL | 1 refills | Status: DC

## 2017-04-01 MED ORDER — VALSARTAN-HYDROCHLOROTHIAZIDE 160-12.5 MG PO TABS: 0.5000 | ORAL_TABLET | Freq: Every day | ORAL | 1 refills | Status: DC

## 2017-04-01 NOTE — Progress Notes (Signed)
Name: Shari Long   MRN: [id number]    DOB: [birth year]   Date:04/01/2017       Progress Note  Subjective  Chief Complaint  Chief Complaint  Patient presents with  . Hypothyroidism    6 month follow up  . Hypertension  . Hyperlipidemia  . Gastroesophageal Reflux  . Depression    HPI  Dyslipidemia: last LDL was 135 , we will recheck labs today.   Obesity: she stopped drinking diet sodas about 6 months. She was avoiding meat and eating mostly vegetables, but has changed her diet back to regular, not exercising as much, she has gained weight since last visit.   GERD: symptoms resolved when she stopped drinking diet drinks. Very seldom takes Mylanta prn   HTN: she is taking medication and denies side effects, no chest pain, palpitation. She is getting dizzy at times, orthostatic and bp is towards low end of normal, we will try to decrease dose of Valsartan/hctz  Major Depression: taking wellbutrin, she states her symptoms are worse during the winter.   Patient Active Problem List   Diagnosis Date Noted  . OSA (obstructive sleep apnea) 04/01/2017  . Dyslipidemia 10/02/2016  . Obesity (BMI 30-39.9) 10/02/2016  . Hypothyroidism due to Hashimoto's thyroiditis 03/29/2016  . Major depression in remission (HCC) 03/29/2016  . GERD without esophagitis 03/29/2016  . Status post vaginal hysterectomy 11/24/2015  . Vaginal atrophy 11/24/2015  . HTN (hypertension) 12/25/2010    Past Surgical History:  Procedure Laterality Date  . CARDIAC CATHETERIZATION    . FOOT SURGERY  1978   left foot   . TONSILLECTOMY    . VAGINAL HYSTERECTOMY  2002    Family History  Problem Relation Age of Onset  . Cancer - Cervical Mother   . Diabetes Mother   . Thyroid disease Sister   . Breast cancer Paternal Aunt 48       great  . Breast cancer Paternal Uncle 22  . Colon cancer Paternal Uncle   . Breast cancer Paternal Grandmother 48  . Breast cancer Cousin        ovarian (50's),  breast(50's) and uterine(60's) cancer on maternal side.   . Ovarian cancer Cousin   . Heart disease Maternal Grandmother   . Heart disease Paternal Grandfather     Social History   Social History  . Marital status: Married    Spouse name: Brett Canales  . Number of children: 2  . Years of education: N/A   Occupational History  . 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 Smoking status: Never Smoker  . Smokeless tobacco: Never Used  . Alcohol use 0.5 oz/week    1 Standard drinks or equivalent per week     Comment: rare  . Drug use: No  . Sexual activity: Yes    Partners: Male    Birth control/ protection: Surgical   Other Topics Concern  . Not on file   Social History Narrative   She was separated from husband in 2005 but reconciled .    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      Current Outpatient Prescriptions:  .  aspirin 81 MG tablet, Take 81 mg by mouth daily.  , Disp: , Rfl:  .  buPROPion (WELLBUTRIN XL) 300 MG 24 hr tablet, Take 1 tablet (300 mg total) by mouth daily., Disp: 90 tablet, Rfl: 1 .  calcium citrate-vitamin  D 200-200 MG-UNIT TABS, Take 1 tablet by mouth daily.  , Disp: , Rfl:  .  fluticasone (FLONASE) 50 MCG/ACT nasal spray, Place 2 sprays into both nostrils daily., Disp: 16 g, Rfl: 6 .  levothyroxine (SYNTHROID, LEVOTHROID) 125 MCG tablet, Take 1 tablet (125 mcg total) by mouth daily., Disp: 30 tablet, Rfl: 1 .  Omega-3 Fatty Acids (FISH OIL) 1000 MG CAPS, Take by mouth.  , Disp: , Rfl:  .  valsartan-hydrochlorothiazide (DIOVAN-HCT) 160-12.5 MG tablet, Take 0.5-1 tablets by mouth daily., Disp: 90 tablet, Rfl: 1 .  vitamin E 400 UNIT capsule, Take 400 Units by mouth daily.  , Disp: , Rfl:   Allergies  Allergen Reactions  . Penicillins Rash     ROS  Constitutional: Negative for fever or weight change.  Respiratory: Negative for cough and shortness of breath.   Cardiovascular: Negative for chest  pain or palpitations.  Gastrointestinal: Negative for abdominal pain, no bowel changes.  Musculoskeletal: Negative for gait problem or joint swelling.  Skin: Negative for rash.  Neurological: Negative for dizziness or headache.  No other specific complaints in a complete review of systems (except as listed in HPI above).  Objective  Vitals:   04/01/17 0809  BP: 104/68  Pulse: 78  Resp: 16  Temp: 98 F (36.7 C)  SpO2: 98%  Weight: 204 lb (92.5 kg)  Height: 5\' 4"  (1.626 m)    Body mass index is 35.02 kg/m.  Physical Exam  Constitutional: Patient appears well-developed and well-nourished. Obese  No distress.  HEENT: head atraumatic, normocephalic, pupils equal and reactive to light,  neck supple, throat within normal limits. Thyroid normal  Cardiovascular: Normal rate, regular rhythm and normal heart sounds.  No murmur heard. No BLE edema. Pulmonary/Chest: Effort normal and breath sounds normal. No respiratory distress. Abdominal: Soft.  There is no tenderness. Psychiatric: Patient has a normal mood and affect. behavior is normal. Judgment and thought content normal.  PHQ2/9: Depression screen [facility] 2/9 10/02/2016 03/29/2016 05/17/2015  Decreased Interest 0 0 0  Down, Depressed, Hopeless 0 0 0  PHQ - 2 Score 0 0 0     Fall Risk: Fall Risk  10/02/2016 03/29/2016 05/17/2015  Falls in the past year? No No No     Assessment & Plan  1. Hypothyroidism due to Hashimoto's thyroiditis  Taking Synthroid 125 mcg daily, never changed the dose - TSH  2. Essential hypertension  bp is towards low end of normal, we will try decreasing dose of Valsartan/hctz - valsartan-hydrochlorothiazide (DIOVAN-HCT) 160-12.5 MG tablet; Take 0.5-1 tablets by mouth daily.  Dispense: 90 tablet; Refill: 1 - COMPLETE METABOLIC PANEL WITH GFR - CBC with Differential/Platelet  3. Dyslipidemia  - Lipid panel  4. Obesity (BMI 30-39.9)  Discussed with the patient the risk posed by an increased BMI.  Discussed importance of portion control, calorie counting and at least 150 minutes of physical activity weekly. Avoid sweet beverages and drink more water. Eat at least 6 servings of fruit and vegetables daily   5. GERD without esophagitis  Controlled at this time  6. Major depression in remission (HCC)  - buPROPion (WELLBUTRIN XL) 300 MG 24 hr tablet; Take 1 tablet (300 mg total) by mouth daily.  Dispense: 90 tablet; Refill: 1  7. Other fatigue  - VITAMIN D 25 Hydroxy (Vit-D Deficiency, Fractures) - Vitamin B12  8. Diabetes mellitus screening  - Insulin, fasting - Hemoglobin A1c  9. OSA (obstructive sleep apnea)  She was diagnosed many years ago, could  not tolerate CPAP, but advised to sleep on her side, no snoring, but wakes up feeling tire at times

## 2017-04-02 LAB — HEMOGLOBIN A1C
HEMOGLOBIN A1C: 5.1 % (ref <5.7)
MEAN PLASMA GLUCOSE: 100 mg/dL

## 2017-04-02 LAB — COMPLETE METABOLIC PANEL WITH GFR
ALT: 15 U/L (ref 6–29)
AST: 24 U/L (ref 10–35)
Albumin: 4.1 g/dL (ref 3.6–5.1)
Alkaline Phosphatase: 90 U/L (ref 33–130)
BUN: 9 mg/dL (ref 7–25)
CHLORIDE: 106 mmol/L (ref 98–110)
CO2: 31 mmol/L (ref 20–32)
CREATININE: 0.79 mg/dL (ref 0.50–1.05)
Calcium: 9.2 mg/dL (ref 8.6–10.4)
GFR, Est African American: >89 mL/min (ref >=60)
GFR, Est Non African American: 83 mL/min (ref >=60)
GLUCOSE: 89 mg/dL (ref 65–99)
Potassium: 4 mmol/L (ref 3.5–5.3)
SODIUM: 142 mmol/L (ref 135–146)
TOTAL PROTEIN: 6.6 g/dL (ref 6.1–8.1)
Total Bilirubin: 0.3 mg/dL (ref 0.2–1.2)

## 2017-04-02 LAB — VITAMIN D 25 HYDROXY (VIT D DEFICIENCY, FRACTURES): Vit D, 25-Hydroxy: 24 ng/mL — ABNORMAL LOW (ref 30–100)

## 2017-04-02 LAB — LIPID PANEL
Cholesterol: 207 mg/dL — ABNORMAL HIGH (ref <200)
HDL: 46 mg/dL — ABNORMAL LOW (ref >50)
LDL CALC: 138 mg/dL — AB (ref <100)
TRIGLYCERIDES: 114 mg/dL (ref <150)
Total CHOL/HDL Ratio: 4.5 Ratio (ref <5.0)
VLDL: 23 mg/dL (ref <30)

## 2017-04-02 LAB — INSULIN, FASTING: Insulin fasting, serum: 7.4 u[IU]/mL (ref 2.0–19.6)

## 2017-04-02 LAB — VITAMIN B12: Vitamin B-12: 583 pg/mL (ref 200–1100)

## 2017-04-02 LAB — TSH: TSH: 2.52 m[IU]/L

## 2017-04-05 ENCOUNTER — Other Ambulatory Visit: Payer: Self-pay | Admitting: Family Medicine

## 2017-04-05 DIAGNOSIS — E038 Other specified hypothyroidism: Secondary | ICD-10-CM

## 2017-04-05 DIAGNOSIS — E063 Autoimmune thyroiditis: Principal | ICD-10-CM

## 2017-04-05 NOTE — Telephone Encounter (Signed)
Patient requesting refill of Levothyroxine to [facility].

## 2017-05-27 ENCOUNTER — Ambulatory Visit
Admission: RE | Admit: 2017-05-27 | Discharge: 2017-05-27 | Disposition: A | Discharge status: Home / Self Care | Payer: 59 | Source: Ambulatory Visit | Attending: Obstetrics and Gynecology | Admitting: Obstetrics and Gynecology

## 2017-05-27 DIAGNOSIS — Z1231 Encounter for screening mammogram for malignant neoplasm of breast: Secondary | ICD-10-CM | POA: Insufficient documentation

## 2017-05-27 DIAGNOSIS — Z1239 Encounter for other screening for malignant neoplasm of breast: Secondary | ICD-10-CM

## 2017-09-16 ENCOUNTER — Other Ambulatory Visit: Payer: Self-pay | Admitting: Family Medicine

## 2017-09-16 DIAGNOSIS — E038 Other specified hypothyroidism: Secondary | ICD-10-CM

## 2017-09-16 DIAGNOSIS — E063 Autoimmune thyroiditis: Principal | ICD-10-CM

## 2017-09-16 NOTE — Telephone Encounter (Signed)
Refill request for thyroid medication: Levothyroxine 125 mcg  Last Physical: 10/02/2016  Lab Results  Component Value Date   TSH 2.52 04/01/2017    Follow-up on file. 10/02/2017  Unsure how she is taking this medication. Two different prescriptions. One is 1 tablet by mouth daily.

## 2017-10-02 ENCOUNTER — Ambulatory Visit: Payer: 59 | Admitting: Family Medicine

## 2017-10-02 ENCOUNTER — Encounter: Payer: Self-pay | Admitting: Family Medicine

## 2017-10-02 VITALS — BP 122/80 | HR 93 | Resp 14 | Ht 64.0 in | Wt 214.7 lb

## 2017-10-02 DIAGNOSIS — K219 Gastro-esophageal reflux disease without esophagitis: Secondary | ICD-10-CM | POA: Diagnosis not present

## 2017-10-02 DIAGNOSIS — E669 Obesity, unspecified: Secondary | ICD-10-CM | POA: Diagnosis not present

## 2017-10-02 DIAGNOSIS — E038 Other specified hypothyroidism: Secondary | ICD-10-CM | POA: Diagnosis not present

## 2017-10-02 DIAGNOSIS — E063 Autoimmune thyroiditis: Secondary | ICD-10-CM

## 2017-10-02 DIAGNOSIS — I10 Essential (primary) hypertension: Secondary | ICD-10-CM

## 2017-10-02 DIAGNOSIS — G4733 Obstructive sleep apnea (adult) (pediatric): Secondary | ICD-10-CM

## 2017-10-02 DIAGNOSIS — E785 Hyperlipidemia, unspecified: Secondary | ICD-10-CM | POA: Diagnosis not present

## 2017-10-02 LAB — TSH: TSH: 1.77 mIU/L (ref 0.40–4.50)

## 2017-10-02 MED ORDER — LEVOTHYROXINE SODIUM 125 MCG PO TABS: 125.0000 ug | ORAL_TABLET | Freq: Every day | ORAL | 1 refills | Status: DC

## 2017-10-02 MED ORDER — VALSARTAN-HYDROCHLOROTHIAZIDE 160-12.5 MG PO TABS: 1.0000 | ORAL_TABLET | Freq: Every day | ORAL | 1 refills | Status: DC

## 2017-10-02 NOTE — Progress Notes (Signed)
Name: Shari Long   MRN: [id number]    DOB: 1960/12/02   Date:10/02/2017       Progress Note  Subjective  Chief Complaint  Chief Complaint  Patient presents with  . Hypertension  . Hypothyroidism  . Depression    HPI    Dyslipidemia: low HDL and high LDL, discussed life style modification  Hypothyroidism: she is taking Synthroid, always feels tired, no change in bowel movement, mild dry skin  Obesity: she stopped drinking diet sodas for about one year, but relapse around holidays - but is off again.. She is frustrated about weight gain. She has not been active, eating a lot of vegetables, but snacking more at night. She states she can binge eat at times and feels guilty about it when she is done. She can eat multiple rice cakes at once, also a half bag of family size chip bags.   GERD: symptoms resolved when she stopped drinking diet drinks. Very seldom takes Mylanta prn   Lower abdominal pain: she has noticed some lower abdominal cramping, not associated with change in bowel movement, blood in stools or urinary changes. It resolves within minutes without intervention  HTN: she is taking medication and denies side effects, no chest pain, palpitation. She states dizziness resolved, still taking one pill daily   Major Depression:  she states her symptoms are usually worse during the winter, but she weaned self off Wellbutrin slowly over the past 4 months and finally came off this past week. She states not feeling sad, no crying spells, states fatigue likely from hypothyroidism  OSA: she was unable to tolerate CPAP machine  Patient Active Problem List   Diagnosis Date Noted  . OSA (obstructive sleep apnea) 04/01/2017  . Dyslipidemia 10/02/2016  . Obesity (BMI 30-39.9) 10/02/2016  . Hypothyroidism due to Hashimoto's thyroiditis 03/29/2016  . Major depression in remission (HCC) 03/29/2016  . GERD without esophagitis 03/29/2016  . Status post vaginal hysterectomy 11/24/2015   . Vaginal atrophy 11/24/2015  . HTN (hypertension) 12/25/2010    Past Surgical History:  Procedure Laterality Date  . CARDIAC CATHETERIZATION    . FOOT SURGERY  1978   left foot   . TONSILLECTOMY    . VAGINAL HYSTERECTOMY  2002    Family History  Problem Relation Age of Onset  . Cancer - Cervical Mother   . Diabetes Mother   . Thyroid disease Sister   . Breast cancer Paternal Aunt 48       great  . Breast cancer Paternal Uncle 1768  . Colon cancer Paternal Uncle   . Breast cancer Paternal Grandmother [id number]  . Breast cancer Cousin        ovarian (50's), breast(50's) and uterine(60's) cancer on maternal side.   . Ovarian cancer Cousin   . Heart disease Maternal Grandmother   . Heart disease Paternal Grandfather     Social History   Socioeconomic History  . Marital status: Married    Spouse name: Brett CanalesSteve  . Number of children: 2  . Years of education: Not on file  . Highest education level: Not on file  Social Needs  . Financial resource strain: Not on file  . Food insecurity - worry: Not on file  . Food insecurity - inability: Not on file  . Transportation needs - medical: Not on file  . Transportation needs - non-medical: Not on file  Occupational History  . Occupation: Producer, television/film/videonurse     Employer: [employer]    Comment: cancer center  Tobacco Use  . Smoking status: Never Smoker  . Smokeless tobacco: Never Used  Substance and Sexual Activity  . Alcohol use: Yes    Alcohol/week: 0.5 oz    Types: 1 Standard drinks or equivalent per week    Comment: rare  . Drug use: No  . Sexual activity: Yes    Partners: Male    Birth control/protection: Surgical  Other Topics Concern  . Not on file  Social History Narrative   She was separated from husband in 2005 but reconciled .    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      Current Outpatient Medications:  .  aspirin 81 MG tablet, Take 81 mg by mouth daily.  , Disp: , Rfl:  .  calcium  citrate-vitamin D 200-200 MG-UNIT TABS, Take 1 tablet by mouth daily.  , Disp: , Rfl:  .  fluticasone (FLONASE) 50 MCG/ACT nasal spray, Place 2 sprays into both nostrils daily., Disp: 16 g, Rfl: 6 .  levothyroxine (SYNTHROID, LEVOTHROID) 125 MCG tablet, Take 1 tablet (125 mcg total) by mouth daily before breakfast., Disp: 90 tablet, Rfl: 1 .  Omega-3 Fatty Acids (FISH OIL) 1000 MG CAPS, Take by mouth.  , Disp: , Rfl:  .  valsartan-hydrochlorothiazide (DIOVAN-HCT) 160-12.5 MG tablet, Take 1 tablet by mouth daily., Disp: 90 tablet, Rfl: 1 .  vitamin E 400 UNIT capsule, Take 400 Units by mouth daily.  , Disp: , Rfl:   Allergies  Allergen Reactions  . Penicillins Rash     ROS  Constitutional: Negative for fever or weight change.  Respiratory: Negative for cough and shortness of breath.   Cardiovascular: Negative for chest pain or palpitations.  Gastrointestinal: Positive  For intermittent lower  abdominal pain, no bowel changes.  Musculoskeletal: Negative for gait problem or joint swelling.  Skin: Negative for rash.  Neurological: Negative for dizziness or headache.  No other specific complaints in a complete review of systems (except as listed in HPI above).  Objective  Vitals:   10/02/17 0835  BP: 122/80  Pulse: 93  Resp: 14  SpO2: 98%  Weight: 214 lb 11.2 oz (97.4 kg)  Height: 5\' 4"  (1.626 m)    Body mass index is 36.85 kg/m.  Physical Exam  Constitutional: Patient appears well-developed and well-nourished. Obese  No distress.  HEENT: head atraumatic, normocephalic, pupils equal and reactive to light,  neck supple, throat within normal limits Cardiovascular: Normal rate, regular rhythm and normal heart sounds.  No murmur heard. No BLE edema. Pulmonary/Chest: Effort normal and breath sounds normal. No respiratory distress. Abdominal: Soft.  There is no tenderness. Psychiatric: Patient has a normal mood and affect. behavior is normal. Judgment and thought content  normal.  PHQ2/9: Depression screen [facility] 2/9 10/02/2017 10/02/2016 03/29/2016 05/17/2015  Decreased Interest 1 0 0 0  Down, Depressed, Hopeless 0 0 0 0  PHQ - 2 Score 1 0 0 0  Altered sleeping 1 - - -  Tired, decreased energy 3 - - -  Change in appetite 2 - - -  Feeling bad or failure about yourself  0 - - -  Trouble concentrating 1 - - -  Moving slowly or fidgety/restless 0 - - -  Suicidal thoughts 0 - - -  PHQ-9 Score 8 - - -  Difficult doing work/chores Somewhat difficult - - -    Fall Risk: Fall Risk  10/02/2017 10/02/2016 03/29/2016 05/17/2015  Falls in the past year? No  No No No     Functional Status Survey: Is the patient deaf or have difficulty hearing?: No Does the patient have difficulty seeing, even when wearing glasses/contacts?: No Does the patient have difficulty concentrating, remembering, or making decisions?: No Does the patient have difficulty walking or climbing stairs?: No Does the patient have difficulty dressing or bathing?: No Does the patient have difficulty doing errands alone such as visiting a doctor's office or shopping?: No    Assessment & Plan  1. Essential hypertension  - valsartan-hydrochlorothiazide (DIOVAN-HCT) 160-12.5 MG tablet; Take 1 tablet by mouth daily.  Dispense: 90 tablet; Refill: 1  2. Hypothyroidism due to Hashimoto's thyroiditis  - TSH - levothyroxine (SYNTHROID, LEVOTHROID) 125 MCG tablet; Take 1 tablet (125 mcg total) by mouth daily before breakfast.  Dispense: 90 tablet; Refill: 1  3. Obesity (BMI 30-39.9)  Discussed with the patient the risk posed by an increased BMI. Discussed importance of portion control, calorie counting and at least 150 minutes of physical activity weekly. Avoid sweet beverages and drink more water. Eat at least 6 servings of fruit and vegetables daily  Possible binge eating disorder and she wants to read about it first  4. GERD without esophagitis   5. Dyslipidemia  Recheck labs next visit   6. OSA  (obstructive sleep apnea)  Never used CPAP - cannot tolerate it

## 2017-10-02 NOTE — Patient Instructions (Addendum)
Obesity, Adult Obesity is the condition of having too much total body fat. Being overweight or obese means that your weight is greater than what is considered healthy for your body size. Obesity is determined by a measurement called BMI. BMI is an estimate of body fat and is calculated from height and weight. For adults, a BMI of 30 or higher is considered obese. Obesity can eventually lead to other health concerns and major illnesses, including:  Stroke.  Coronary artery disease (CAD).  Type 2 diabetes.  Some types of cancer, including cancers of the colon, breast, uterus, and gallbladder.  Osteoarthritis.  High blood pressure (hypertension).  High cholesterol.  Sleep apnea.  Gallbladder stones.  Infertility problems.  What are the causes? The main cause of obesity is taking in (consuming) more calories than your body uses for energy. Other factors that contribute to this condition may include:  Being born with genes that make you more likely to become obese.  Having a medical condition that causes obesity. These conditions include: ? Hypothyroidism. ? Polycystic ovarian syndrome (PCOS). ? Binge-eating disorder. ? Cushing syndrome.  Taking certain medicines, such as steroids, antidepressants, and seizure medicines.  Not being physically active (sedentary lifestyle).  Living where there are limited places to exercise safely or buy healthy foods.  Not getting enough sleep.  What increases the risk? The following factors may increase your risk of this condition:  Having a family history of obesity.  Being a woman of African-American descent.  Being a man of Hispanic descent.  What are the signs or symptoms? Having excessive body fat is the main symptom of this condition. How is this diagnosed? This condition may be diagnosed based on:  Your symptoms.  Your medical history.  A physical exam. Your health care provider may measure: ? Your BMI. If you are an  adult with a BMI between 25 and less than 30, you are considered overweight. If you are an adult with a BMI of 30 or higher, you are considered obese. ? The distances around your hips and your waist (circumferences). These may be compared to each other to help diagnose your condition. ? Your skinfold thickness. Your health care provider may gently pinch a fold of your skin and measure it.  How is this treated? Treatment for this condition often includes changing your lifestyle. Treatment may include some or all of the following:  Dietary changes. Work with your health care provider and a dietitian to set a weight-loss goal that is healthy and reasonable for you. Dietary changes may include eating: ? Smaller portions. A portion size is the amount of a particular food that is healthy for you to eat at one time. This varies from person to person. ? Low-calorie or low-fat options. ? More whole grains, fruits, and vegetables.  Regular physical activity. This may include aerobic activity (cardio) and strength training.  Medicine to help you lose weight. Your health care provider may prescribe medicine if you are unable to lose 1 pound a week after 6 weeks of eating more healthily and doing more physical activity.  Surgery. Surgical options may include gastric banding and gastric bypass. Surgery may be done if: ? Other treatments have not helped to improve your condition. ? You have a BMI of 40 or higher. ? You have life-threatening health problems related to obesity.  Follow these instructions at home:  Eating and drinking   Follow recommendations from your health care provider about what you eat and drink. Your health   care provider may advise you to: ? Limit fast foods, sweets, and processed snack foods. ? Choose low-fat options, such as low-fat milk instead of whole milk. ? Eat 5 or more servings of fruits or vegetables every day. ? Eat at home more often. This gives you more control over  what you eat. ? Choose healthy foods when you eat out. ? Learn what a healthy portion size is. ? Keep low-fat snacks on hand. ? Avoid sugary drinks, such as soda, fruit juice, iced tea sweetened with sugar, and flavored milk. ? Eat a healthy breakfast.  Drink enough water to keep your urine clear or pale yellow.  Do not go without eating for long periods of time (do not fast) or follow a fad diet. Fasting and fad diets can be unhealthy and even dangerous. Physical Activity  Exercise regularly, as told by your health care provider. Ask your health care provider what types of exercise are safe for you and how often you should exercise.  Warm up and stretch before being active.  Cool down and stretch after being active.  Rest between periods of activity. Lifestyle  Limit the time that you spend in front of your TV, computer, or video game system.  Find ways to reward yourself that do not involve food.  Limit alcohol intake to no more than 1 drink a day for nonpregnant women and 2 drinks a day for men. One drink equals 12 oz of beer, 5 oz of wine, or 1 oz of hard liquor. General instructions  Keep a weight loss journal to keep track of the food you eat and how much you exercise you get.  Take over-the-counter and prescription medicines only as told by your health care provider.  Take vitamins and supplements only as told by your health care provider.  Consider joining a support group. Your health care provider may be able to recommend a support group.  Keep all follow-up visits as told by your health care provider. This is important. Contact a health care provider if:  You are unable to meet your weight loss goal after 6 weeks of dietary and lifestyle changes. This information is not intended to replace advice given to you by your health care provider. Make sure you discuss any questions you have with your health care provider. Document Released: 09/06/2004 Document Revised:  01/02/2016 Document Reviewed: 05/18/2015 Elsevier Interactive Patient Education  2018 [employer].  Binge-Eating Disorder Binge-eating disorder is an eating disorder that involves repeated episodes of binge eating. Binge eating refers to eating a larger-than-normal amount of food in a short period of time. People with binge-eating disorder feel unable to control their eating. Although they feel bad about overeating, they usually do not try to make up for overeating. They usually do not make themselves vomit, use laxatives, starve themselves, or exercise too much. Binge-eating disorder usually starts in the teenage years or early 69s. It often gets worse with stress. What are the causes? The cause is unknown. What increases the risk? Risk factors include:  Being a teenager or in your early 67s.  Being female. Binge-eating disorder can affect males, but it is more common in females.  Being overweight or obese.  Having a mental health disorder, such as depression or anxiety.  Having a substance use disorder, such as alcohol use disorder.  What are the signs or symptoms? Symptoms may include:  Eating much more quickly than normal.  Eating to the point of feeling physically uncomfortable.  Eating large  amounts of food when you are not hungry.  Eating alone because you are embarrassed by how much you are eating.  Feeling disgusted, depressed, or guilty after overeating.  How is this diagnosed? The diagnosis of binge-eating disorder is made through an assessment by your health care provider. You may be diagnosed with the disorder if you:  Binge eat at least once a week on average for three months or longer.  Have three or more of the symptoms of the disorder.  Once you have been diagnosed, your level of binge-eating disorder will be rated from mild to severe. The rating is based on how often you binge eat. How is this treated? Treatment is usually provided by mental health  professionals, such as psychologists, psychiatrists, licensed professional counselors, and clinical social workers. The following treatment options are available:  Cognitive behavioral therapy. This is a form of talk therapy that helps you recognize the thoughts, beliefs, and emotions that contribute to overeating and helps you change them.  Interpersonal psychotherapy. This is a form of talk therapy that focuses on fixing relationship problems that trigger binge-eating episodes.  Dialectical behavioral therapy. This is a form of talk therapy that helps you learn skills to control your emotions and tolerate distress without binge eating.  Medicine.  Weight-loss programs. These can be important if you are overweight. Losing excess weight can improve physical health and the way you feel about yourself.  Follow these instructions at home:  Keep all follow-up visits as directed by your health care provider. This is important.  Take medicines only as directed by your health care provider. Where to find more information: National Eating Disorders Association (NEDA): www.nationaleatingdisorders.org Contact a health care provider if:  Your symptoms get worse.  You start having new symptoms.  You start compensating for eating binges with harmful behaviors, such as: ? Making yourself vomit. ? Exercising too much. ? Using laxatives. Get help right away if: You have serious thoughts about hurting yourself or someone else. This information is not intended to replace advice given to you by your health care provider. Make sure you discuss any questions you have with your health care provider. Document Released: 12/20/2010 Document Revised: 01/05/2016 Document Reviewed: 11/09/2013 Elsevier Interactive Patient Education  2018 [employer]   RESEARCH THE MEDICATION: Arlyce HarmanVYVANSE

## 2018-03-21 ENCOUNTER — Other Ambulatory Visit: Payer: Self-pay | Admitting: Family Medicine

## 2018-03-21 ENCOUNTER — Other Ambulatory Visit: Payer: Self-pay | Admitting: Obstetrics and Gynecology

## 2018-03-21 DIAGNOSIS — Z1231 Encounter for screening mammogram for malignant neoplasm of breast: Secondary | ICD-10-CM

## 2018-04-06 ENCOUNTER — Telehealth: Payer: 59 | Admitting: Physician Assistant

## 2018-04-06 DIAGNOSIS — R300 Dysuria: Secondary | ICD-10-CM

## 2018-04-06 MED ORDER — NITROFURANTOIN MONOHYD MACRO 100 MG PO CAPS: 100.0000 mg | ORAL_CAPSULE | Freq: Two times a day (BID) | ORAL | 0 refills | Status: DC

## 2018-04-06 NOTE — Progress Notes (Signed)

## 2018-04-08 ENCOUNTER — Encounter: Payer: Self-pay | Admitting: Family Medicine

## 2018-04-08 ENCOUNTER — Ambulatory Visit (INDEPENDENT_AMBULATORY_CARE_PROVIDER_SITE_OTHER): Payer: 59 | Admitting: Family Medicine

## 2018-04-08 VITALS — BP 138/88 | HR 85 | Temp 98.7°F | Resp 14 | Ht 64.0 in | Wt 219.8 lb

## 2018-04-08 DIAGNOSIS — Z01419 Encounter for gynecological examination (general) (routine) without abnormal findings: Secondary | ICD-10-CM | POA: Diagnosis not present

## 2018-04-08 DIAGNOSIS — F330 Major depressive disorder, recurrent, mild: Secondary | ICD-10-CM | POA: Diagnosis not present

## 2018-04-08 DIAGNOSIS — Z131 Encounter for screening for diabetes mellitus: Secondary | ICD-10-CM | POA: Diagnosis not present

## 2018-04-08 DIAGNOSIS — Z1231 Encounter for screening mammogram for malignant neoplasm of breast: Secondary | ICD-10-CM

## 2018-04-08 DIAGNOSIS — G4733 Obstructive sleep apnea (adult) (pediatric): Secondary | ICD-10-CM

## 2018-04-08 DIAGNOSIS — N3090 Cystitis, unspecified without hematuria: Secondary | ICD-10-CM

## 2018-04-08 DIAGNOSIS — K219 Gastro-esophageal reflux disease without esophagitis: Secondary | ICD-10-CM

## 2018-04-08 DIAGNOSIS — E038 Other specified hypothyroidism: Secondary | ICD-10-CM | POA: Diagnosis not present

## 2018-04-08 DIAGNOSIS — E785 Hyperlipidemia, unspecified: Secondary | ICD-10-CM

## 2018-04-08 DIAGNOSIS — E063 Autoimmune thyroiditis: Secondary | ICD-10-CM | POA: Diagnosis not present

## 2018-04-08 DIAGNOSIS — I10 Essential (primary) hypertension: Secondary | ICD-10-CM

## 2018-04-08 DIAGNOSIS — Z1239 Encounter for other screening for malignant neoplasm of breast: Secondary | ICD-10-CM

## 2018-04-08 MED ORDER — VALSARTAN-HYDROCHLOROTHIAZIDE 160-12.5 MG PO TABS: 1.0000 | ORAL_TABLET | Freq: Every day | ORAL | 1 refills | Status: DC

## 2018-04-08 MED ORDER — LEVOTHYROXINE SODIUM 125 MCG PO TABS: 125.0000 ug | ORAL_TABLET | Freq: Every day | ORAL | 1 refills | Status: DC

## 2018-04-08 NOTE — Progress Notes (Signed)
Name: Shari Long   MRN: [id number]    DOB: Feb 12, 1961   Date:04/08/2018       Progress Note  Subjective  Chief Complaint  Chief Complaint  Patient presents with  . Annual Exam  . Urinary Tract Infection    E-visit on antibiotic    HPI   Patient presents for annual CPE and follow up  Dyslipidemia:low HDL and high LDL, discussed life style modification. She prefers not taking medication for now.   Hypothyroidism: she is taking Synthroid, always feels tired, no change in bowel movement, mild dry skin We will recheck labs.   Obesity: she states she skips breakfast, does not eat a lot during the day because carbs makes her sleepy but she overeats at night.   GERD: symptoms resolved when she stopped drinking diet drinks.Very seldom takes Mylanta prnDoing well.   HTN: she is taking medication and denies side effects, no chest pain, palpitation.She states dizziness resolved, still taking one pill daily . BP is elevated today, but she states mother had a recent fall, she is getting treated for cystitis and not sleeping well. Advised her to recheck at work and return sooner if needed.   Major Depression:  she states her symptoms are usually worse during the winter, but she weaned self off Wellbutrin slowly last Summer and does not want to resume medication. Phq9 is slightly better than last visit. She has been under more stress this past two weeks and states usually doing well.   OSA: she was unable to tolerate CPAP machine. Unchanged.   Diet: discussed importance of high calcium , fruit and vegetables.  Exercise: discussed increasing to 150 minutes per week.   USPSTF grade A and B recommendations    Office Visit from 04/08/2018 in [facility]  AUDIT-C Score  0     Depression:  Depression screen [facility] 2/9 04/08/2018 10/02/2017 10/02/2016 03/29/2016 05/17/2015  Decreased Interest 0 1 0 0 0  Down, Depressed, Hopeless 1 0 0 0 0  PHQ - 2 Score 1 1 0 0 0   Altered sleeping 1 1 - - -  Tired, decreased energy 1 3 - - -  Change in appetite 1 2 - - -  Feeling bad or failure about yourself  1 0 - - -  Trouble concentrating 1 1 - - -  Moving slowly or fidgety/restless 0 0 - - -  Suicidal thoughts 0 0 - - -  PHQ-9 Score 6 8 - - -  Difficult doing work/chores Not difficult at all Somewhat difficult - - -   Hypertension: BP Readings from Last 3 Encounters:  10/02/17 122/80  04/01/17 104/68  12/05/16 100/66   Obesity: Wt Readings from Last 3 Encounters:  04/08/18 219 lb 12.8 oz (99.7 kg)  10/02/17 214 lb 11.2 oz (97.4 kg)  04/01/17 204 lb (92.5 kg)   BMI Readings from Last 3 Encounters:  04/08/18 37.73 kg/m  10/02/17 36.85 kg/m  04/01/17 35.02 kg/m    Hep C Screening: negative screen  STD testing and prevention (HIV/chl/gon/syphilis): N/A Intimate partner violence: negative screen  Sexual History/Pain during Intercourse: not very active, it causes some pain from dryness, discussed lubes  Menstrual History/LMP/Abnormal Bleeding:s/p hysterectomy  Incontinence Symptoms: occasionally stress, doing kegel sometimes wears a pad   Advanced Care Planning: A voluntary discussion about advance care planning including the explanation and discussion of advance directives.  Discussed health care proxy and Living will, and the patient was able to identify a health  care proxy as husband   Patient does not have a living will at present time. If patient does have living will, I have requested they bring this to the clinic to be scanned in to their chart.  Breast cancer:  HM Mammogram  Date Value Ref Range Status  08/13/2014 Self Reported Normal 0-4 Bi-Rad, Self Reported Normal Final    BRCA gene screening: she had genetic test that was negative  Cervical cancer screening: up to date   Osteoporosis Screening: discussed and she will wait until she turns 63    Lipids:  Lab Results  Component Value Date   CHOL 207 (H) 04/01/2017   CHOL 210 (H)  03/29/2016   CHOL 207 (H) 05/18/2015   Lab Results  Component Value Date   HDL 46 (L) 04/01/2017   HDL 52 03/29/2016   HDL 52 05/18/2015   Lab Results  Component Value Date   LDLCALC 138 (H) 04/01/2017   LDLCALC 135 (H) 03/29/2016   LDLCALC 135 (H) 05/18/2015   Lab Results  Component Value Date   TRIG 114 04/01/2017   TRIG 114 03/29/2016   TRIG 98 05/18/2015   Lab Results  Component Value Date   CHOLHDL 4.5 04/01/2017   CHOLHDL 4.0 03/29/2016   CHOLHDL 4.0 05/18/2015   No results found for: LDLDIRECT  Glucose:  Glucose  Date Value Ref Range Status  05/18/2015 94 65 - 99 mg/dL Final  10/14/2013 83 65 - 99 mg/dL Final   Glucose, Bld  Date Value Ref Range Status  04/01/2017 89 65 - 99 mg/dL Final  03/29/2016 94 65 - 99 mg/dL Final    Skin cancer: discussed atypical moles  Colorectal cancer: repeat in 2021 Lung cancer:   Low Dose CT Chest recommended if Age 62-80 years, 30 pack-year currently smoking OR have quit w/in 15years. Patient does not qualify.   ECG: 2012   Patient Active Problem List   Diagnosis Date Noted  . OSA (obstructive sleep apnea) 04/01/2017  . Dyslipidemia 10/02/2016  . Morbid obesity ([place]) 10/02/2016  . Hypothyroidism due to Hashimoto's thyroiditis 03/29/2016  . Major depression in remission ([place]) 03/29/2016  . GERD without esophagitis 03/29/2016  . Status post vaginal hysterectomy 11/24/2015  . Vaginal atrophy 11/24/2015  . HTN (hypertension) 12/25/2010    Past Surgical History:  Procedure Laterality Date  . CARDIAC CATHETERIZATION    . FOOT SURGERY  1978   left foot   . TONSILLECTOMY    . VAGINAL HYSTERECTOMY  2002    Family History  Problem Relation Age of Onset  . Cancer - Cervical Mother   . Diabetes Mother   . Thyroid disease Sister   . Breast cancer Paternal Aunt 13       great  . Breast cancer Paternal Uncle 15  . Colon cancer Paternal Uncle   . Breast cancer Paternal Grandmother 2  . Breast cancer Cousin         ovarian (50's), breast(50's) and uterine(60's) cancer on maternal side.   . Ovarian cancer Cousin   . Heart disease Maternal Grandmother   . Heart disease Paternal Grandfather     Social History   Socioeconomic History  . Marital status: Married    Spouse name: Richardson Landry  . Number of children: 2  . Years of education: Not on file  . Highest education level: Master's degree (e.g., MA, MS, MEng, MEd, MSW, MBA)  Occupational History  . 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 Financial resource strain: Not hard at all  . Food insecurity:    Worry: Never true    Inability: Never true  . Transportation needs:    Medical: No    Non-medical: No  Tobacco Use  . Smoking status: Never Smoker  . Smokeless tobacco: Never Used  Substance and Sexual Activity  . Alcohol use: Yes    Alcohol/week: 1.0 standard drinks    Types: 1 Standard drinks or equivalent per week    Comment: rare  . Drug use: No  . Sexual activity: Yes    Partners: Male    Birth control/protection: Surgical  Lifestyle  . Physical activity:    Days per week: 3 days    Minutes per session: 20 min  . Stress: Not on file  Relationships  . Social connections:    Talks on phone: More than three times a week    Gets together: More than three times a week    Attends religious service: More than 4 times per year    Active member of club or organization: No    Attends meetings of clubs or organizations: Never    Relationship status: Married  . Intimate partner violence:    Fear of current or ex partner: No    Emotionally abused: No    Physically abused: No    Forced sexual activity: No  Other Topics Concern  . Not on file  Social History Narrative   She was separated from husband in 2005 but reconciled .    Works as Marine scientist at [employer]   Two grown sons and two grandchildren.      Current Outpatient Medications:  .  aspirin 81 MG tablet, Take 81 mg by mouth daily.  ,  Disp: , Rfl:  .  calcium citrate-vitamin D 200-200 MG-UNIT TABS, Take 1 tablet by mouth daily.  , Disp: , Rfl:  .  fluticasone (FLONASE) 50 MCG/ACT nasal spray, Place 2 sprays into both nostrils daily. (Patient taking differently: Place 2 sprays into both nostrils as needed. ), Disp: 16 g, Rfl: 6 .  levothyroxine (SYNTHROID, LEVOTHROID) 125 MCG tablet, Take 1 tablet (125 mcg total) by mouth daily before breakfast., Disp: 90 tablet, Rfl: 1 .  loratadine (CLARITIN) 10 MG tablet, Take 10 mg by mouth daily., Disp: , Rfl:  .  nitrofurantoin, macrocrystal-monohydrate, (MACROBID) 100 MG capsule, Take 1 capsule (100 mg total) by mouth 2 (two) times daily., Disp: 10 capsule, Rfl: 0 .  Omega-3 Fatty Acids (FISH OIL) 1000 MG CAPS, Take by mouth.  , Disp: , Rfl:  .  valsartan-hydrochlorothiazide (DIOVAN-HCT) 160-12.5 MG tablet, Take 1 tablet by mouth daily., Disp: 90 tablet, Rfl: 1 .  vitamin E 400 UNIT capsule, Take 400 Units by mouth daily.  , Disp: , Rfl:   Allergies  Allergen Reactions  . Penicillins Rash     ROS  Constitutional: Negative for fever or weight change.  Respiratory: Negative for cough and shortness of breath.   Cardiovascular: Negative for chest pain or palpitations.  Gastrointestinal: positive  for abdominal pain - from recent cystitis but getting better, no bowel changes.  Musculoskeletal: Negative for gait problem or joint swelling.  Skin: Negative for rash.  Neurological: Negative for dizziness or headache.  No other specific complaints in a complete review of systems (except as listed in HPI above).  Objective  Vitals:   04/08/18 0817  Pulse: 85  Resp: 14  Temp: 98.7 F (37.1  C)  TempSrc: Oral  SpO2: (!) 85%  Weight: 219 lb 12.8 oz (99.7 kg)  Height: 5' 4"  (1.626 m)    Body mass index is 37.73 kg/m.  Physical Exam  Constitutional: Patient appears well-developed and obese. No distress.  HENT: Head: Normocephalic and atraumatic. Ears: B TMs ok, no erythema or  effusion; Nose: Nose normal. Mouth/Throat: Oropharynx is clear and moist. No oropharyngeal exudate.  Eyes: Conjunctivae and EOM are normal. Pupils are equal, round, and reactive to light. No scleral icterus.  Neck: Normal range of motion. Neck supple. No JVD present. No thyromegaly present.  Cardiovascular: Normal rate, regular rhythm and normal heart sounds.  No murmur heard. No BLE edema. Pulmonary/Chest: Effort normal and breath sounds normal. No respiratory distress. Abdominal: Soft. Bowel sounds are normal, no distension. There is no tenderness. no masses Breast: no lumps or masses, no nipple discharge or rashes FEMALE GENITALIA:  Not due yet, recheck next year  RECTAL: not done  Musculoskeletal: Normal range of motion, no joint effusions. No gross deformities Neurological: he is alert and oriented to person, place, and time. No cranial nerve deficit. Coordination, balance, strength, speech and gait are normal.  Skin: Skin is warm and dry. No rash noted. No erythema.  Psychiatric: Patient has a normal mood and affect. behavior is normal. Judgment and thought content normal.  PHQ2/9: Depression screen [facility] 2/9 04/08/2018 10/02/2017 10/02/2016 03/29/2016 05/17/2015  Decreased Interest 0 1 0 0 0  Down, Depressed, Hopeless 1 0 0 0 0  PHQ - 2 Score 1 1 0 0 0  Altered sleeping 1 1 - - -  Tired, decreased energy 1 3 - - -  Change in appetite 1 2 - - -  Feeling bad or failure about yourself  1 0 - - -  Trouble concentrating 1 1 - - -  Moving slowly or fidgety/restless 0 0 - - -  Suicidal thoughts 0 0 - - -  PHQ-9 Score 6 8 - - -  Difficult doing work/chores Not difficult at all Somewhat difficult - - -    Fall Risk: Fall Risk  04/08/2018 10/02/2017 10/02/2016 03/29/2016 05/17/2015  Falls in the past year? No No No No No     Functional Status Survey: Is the patient deaf or have difficulty hearing?: No Does the patient have difficulty seeing, even when wearing glasses/contacts?: No Does the  patient have difficulty concentrating, remembering, or making decisions?: No Does the patient have difficulty walking or climbing stairs?: No Does the patient have difficulty dressing or bathing?: No Does the patient have difficulty doing errands alone such as visiting a doctor's office or shopping?: No   Assessment & Plan  1. Well woman exam    2. Dyslipidemia  - Lipid panel  3. Hypothyroidism due to Hashimoto's thyroiditis  - levothyroxine (SYNTHROID, LEVOTHROID) 125 MCG tablet; Take 1 tablet (125 mcg total) by mouth daily before breakfast.  Dispense: 90 tablet; Refill: 1 - TSH  4. Essential hypertension  - valsartan-hydrochlorothiazide (DIOVAN-HCT) 160-12.5 MG tablet; Take 1 tablet by mouth daily.  Dispense: 90 tablet; Refill: 1 - COMPLETE METABOLIC PANEL WITH GFR - CBC with Differential/Platelet  5. Mild episode of recurrent major depressive disorder ([place])  She states had a lot of deaths in her family lately, but states feels well otherwise   6. OSA (obstructive sleep apnea)   7. GERD without esophagitis  Continue prn medication   8. Morbid obesity   Discussed with the patient the risk posed by an increased  BMI. Discussed importance of portion control, calorie counting and at least 150 minutes of physical activity weekly. Avoid sweet beverages and drink more water. Eat at least 6 servings of fruit and vegetables daily   9. Diabetes mellitus screening  - Hemoglobin A1c  10. Breast cancer screening  She is scheduled for mammogram    11. Cystitis  She had E-visit and is taking Macrobid and is feeling better    -USPSTF grade A and B recommendations reviewed with patient; age-appropriate recommendations, preventive care, screening tests, etc discussed and encouraged; healthy living encouraged; see AVS for patient education given to patient -Discussed importance of 150 minutes of physical activity weekly, eat two servings of fish weekly, eat one serving of tree  nuts ( cashews, pistachios, pecans, almonds.Marland Kitchen) every other day, eat 6 servings of fruit/vegetables daily and drink plenty of water and avoid sweet beverages.

## 2018-04-09 ENCOUNTER — Other Ambulatory Visit: Payer: Self-pay | Admitting: Family Medicine

## 2018-04-09 DIAGNOSIS — E063 Autoimmune thyroiditis: Principal | ICD-10-CM

## 2018-04-09 DIAGNOSIS — E038 Other specified hypothyroidism: Secondary | ICD-10-CM

## 2018-04-09 DIAGNOSIS — Z1231 Encounter for screening mammogram for malignant neoplasm of breast: Secondary | ICD-10-CM

## 2018-04-09 LAB — COMPLETE METABOLIC PANEL WITH GFR
AG RATIO: 1.6 (calc) (ref 1.0–2.5)
ALT: 23 U/L (ref 6–29)
AST: 30 U/L (ref 10–35)
Albumin: 4.3 g/dL (ref 3.6–5.1)
Alkaline phosphatase (APISO): 107 U/L (ref 33–130)
BILIRUBIN TOTAL: 0.2 mg/dL (ref 0.2–1.2)
BUN: 8 mg/dL (ref 7–25)
CALCIUM: 9.8 mg/dL (ref 8.6–10.4)
CHLORIDE: 105 mmol/L (ref 98–110)
CO2: 25 mmol/L (ref 20–32)
Creat: 0.86 mg/dL (ref 0.50–1.05)
GFR, EST AFRICAN AMERICAN: 86 mL/min/{1.73_m2} (ref >=60)
GFR, EST NON AFRICAN AMERICAN: 74 mL/min/{1.73_m2} (ref >=60)
GLUCOSE: 90 mg/dL (ref 65–99)
Globulin: 2.7 g/dL (calc) (ref 1.9–3.7)
Potassium: 4 mmol/L (ref 3.5–5.3)
Sodium: 140 mmol/L (ref 135–146)
Total Protein: 7 g/dL (ref 6.1–8.1)

## 2018-04-09 LAB — CBC WITH DIFFERENTIAL/PLATELET
BASOS PCT: 1.1 %
Basophils Absolute: 63 cells/uL (ref 0–200)
EOS ABS: 194 {cells}/uL (ref 15–500)
Eosinophils Relative: 3.4 %
HCT: 41.5 % (ref 35.0–45.0)
HEMOGLOBIN: 13.7 g/dL (ref 11.7–15.5)
Lymphs Abs: 2069 cells/uL (ref 850–3900)
MCH: 28.4 pg (ref 27.0–33.0)
MCHC: 33 g/dL (ref 32.0–36.0)
MCV: 86.1 fL (ref 80.0–100.0)
MONOS PCT: 7.8 %
MPV: 9.7 fL (ref 7.5–12.5)
NEUTROS ABS: 2930 {cells}/uL (ref 1500–7800)
Neutrophils Relative %: 51.4 %
Platelets: 309 10*3/uL (ref 140–400)
RBC: 4.82 10*6/uL (ref 3.80–5.10)
RDW: 12.5 % (ref 11.0–15.0)
Total Lymphocyte: 36.3 %
WBC: 5.7 10*3/uL (ref 3.8–10.8)
WBCMIX: 445 {cells}/uL (ref 200–950)

## 2018-04-09 LAB — LIPID PANEL
CHOL/HDL RATIO: 5 (calc) — AB (ref <5.0)
Cholesterol: 223 mg/dL — ABNORMAL HIGH (ref <200)
HDL: 45 mg/dL — ABNORMAL LOW (ref >50)
LDL CHOLESTEROL (CALC): 149 mg/dL — AB
Non-HDL Cholesterol (Calc): 178 mg/dL (calc) — ABNORMAL HIGH (ref <130)
Triglycerides: 157 mg/dL — ABNORMAL HIGH (ref <150)

## 2018-04-09 LAB — HEMOGLOBIN A1C
Hgb A1c MFr Bld: 5.3 % of total Hgb (ref <5.7)
MEAN PLASMA GLUCOSE: 105 (calc)
eAG (mmol/L): 5.8 (calc)

## 2018-04-09 LAB — TSH: TSH: 0.3 mIU/L — ABNORMAL LOW (ref 0.40–4.50)

## 2018-04-10 ENCOUNTER — Encounter: Payer: Self-pay | Admitting: Family Medicine

## 2018-04-16 ENCOUNTER — Other Ambulatory Visit: Payer: Self-pay | Admitting: Family Medicine

## 2018-04-16 DIAGNOSIS — N3090 Cystitis, unspecified without hematuria: Secondary | ICD-10-CM | POA: Diagnosis not present

## 2018-04-16 NOTE — Telephone Encounter (Signed)
Per Tiffany this needs to be ordered. Patient came in at 9:02 this morning

## 2018-04-18 ENCOUNTER — Other Ambulatory Visit: Payer: Self-pay | Admitting: Family Medicine

## 2018-04-18 LAB — CULTURE, URINE COMPREHENSIVE
MICRO NUMBER:: 91060435
SPECIMEN QUALITY: ADEQUATE

## 2018-04-18 MED ORDER — SULFAMETHOXAZOLE-TRIMETHOPRIM 800-160 MG PO TABS: 1.0000 | ORAL_TABLET | Freq: Two times a day (BID) | ORAL | 0 refills | Status: DC

## 2018-05-28 ENCOUNTER — Ambulatory Visit
Admission: RE | Admit: 2018-05-28 | Discharge: 2018-05-28 | Disposition: A | Discharge status: Home / Self Care | Payer: 59 | Source: Ambulatory Visit | Attending: Family Medicine | Admitting: Family Medicine

## 2018-05-28 DIAGNOSIS — Z1231 Encounter for screening mammogram for malignant neoplasm of breast: Secondary | ICD-10-CM | POA: Diagnosis not present

## 2018-10-02 ENCOUNTER — Other Ambulatory Visit: Payer: Self-pay | Admitting: Family Medicine

## 2018-10-02 DIAGNOSIS — E063 Autoimmune thyroiditis: Principal | ICD-10-CM

## 2018-10-02 DIAGNOSIS — E038 Other specified hypothyroidism: Secondary | ICD-10-CM

## 2018-10-02 NOTE — Telephone Encounter (Signed)
Refill request for thyroid medication. Levothyroxine   Last Physical: 04/08/2018   Lab Results  Component Value Date   TSH 0.30 (L) 04/08/2018     Follow up on 01/08/2019

## 2018-10-02 NOTE — Telephone Encounter (Signed)
She needs to have repeat TSH, last one was too low , she can come in today

## 2018-10-06 ENCOUNTER — Other Ambulatory Visit: Payer: Self-pay | Admitting: Family Medicine

## 2018-10-06 DIAGNOSIS — E038 Other specified hypothyroidism: Secondary | ICD-10-CM

## 2018-10-06 DIAGNOSIS — E063 Autoimmune thyroiditis: Principal | ICD-10-CM

## 2018-10-10 ENCOUNTER — Encounter: Payer: Self-pay | Admitting: Family Medicine

## 2018-10-11 ENCOUNTER — Other Ambulatory Visit: Payer: Self-pay | Admitting: Family Medicine

## 2018-10-11 DIAGNOSIS — E038 Other specified hypothyroidism: Secondary | ICD-10-CM

## 2018-10-11 DIAGNOSIS — E063 Autoimmune thyroiditis: Principal | ICD-10-CM

## 2018-10-13 ENCOUNTER — Telehealth: Payer: Self-pay

## 2018-10-13 NOTE — Telephone Encounter (Signed)
Patient was notified by phone to come in to have her Thyroid labs recheck before Dr. Carlynn Purl is able to send in a new prescription. Patient states she will come in tomorrow and lab slip is upfront. Patient states she has enough medication to last her until the end of this week and I informed her labs usually come back in the 1 to 2 days. So as soon as we get her results Dr. Carlynn Purl will be able to adjust her medication. Patient verbalized understanding.

## 2018-10-13 NOTE — Progress Notes (Signed)
Patient received message through MyChart and note through result note.

## 2018-10-14 ENCOUNTER — Other Ambulatory Visit: Payer: Self-pay | Admitting: Family Medicine

## 2018-10-14 DIAGNOSIS — E063 Autoimmune thyroiditis: Secondary | ICD-10-CM | POA: Diagnosis not present

## 2018-10-14 DIAGNOSIS — E038 Other specified hypothyroidism: Secondary | ICD-10-CM

## 2018-10-14 LAB — TSH: TSH: 2.04 mIU/L (ref 0.40–4.50)

## 2018-10-14 MED ORDER — LEVOTHYROXINE SODIUM 125 MCG PO TABS: 125.0000 ug | ORAL_TABLET | Freq: Every day | ORAL | 1 refills | Status: DC

## 2019-01-08 ENCOUNTER — Ambulatory Visit (INDEPENDENT_AMBULATORY_CARE_PROVIDER_SITE_OTHER): Payer: 59 | Admitting: Family Medicine

## 2019-01-08 ENCOUNTER — Encounter: Payer: Self-pay | Admitting: Family Medicine

## 2019-01-08 ENCOUNTER — Other Ambulatory Visit: Payer: Self-pay

## 2019-01-08 DIAGNOSIS — Z01419 Encounter for gynecological examination (general) (routine) without abnormal findings: Secondary | ICD-10-CM | POA: Diagnosis not present

## 2019-01-08 DIAGNOSIS — I10 Essential (primary) hypertension: Secondary | ICD-10-CM

## 2019-01-08 DIAGNOSIS — E038 Other specified hypothyroidism: Secondary | ICD-10-CM | POA: Diagnosis not present

## 2019-01-08 DIAGNOSIS — K219 Gastro-esophageal reflux disease without esophagitis: Secondary | ICD-10-CM

## 2019-01-08 DIAGNOSIS — E785 Hyperlipidemia, unspecified: Secondary | ICD-10-CM | POA: Diagnosis not present

## 2019-01-08 DIAGNOSIS — E063 Autoimmune thyroiditis: Secondary | ICD-10-CM

## 2019-01-08 DIAGNOSIS — R5383 Other fatigue: Secondary | ICD-10-CM | POA: Diagnosis not present

## 2019-01-08 DIAGNOSIS — M5416 Radiculopathy, lumbar region: Secondary | ICD-10-CM

## 2019-01-08 DIAGNOSIS — F330 Major depressive disorder, recurrent, mild: Secondary | ICD-10-CM

## 2019-01-08 DIAGNOSIS — Z131 Encounter for screening for diabetes mellitus: Secondary | ICD-10-CM | POA: Diagnosis not present

## 2019-01-08 DIAGNOSIS — G4733 Obstructive sleep apnea (adult) (pediatric): Secondary | ICD-10-CM

## 2019-01-08 DIAGNOSIS — Z1239 Encounter for other screening for malignant neoplasm of breast: Secondary | ICD-10-CM

## 2019-01-08 MED ORDER — VALSARTAN-HYDROCHLOROTHIAZIDE 160-12.5 MG PO TABS: 1.0000 | ORAL_TABLET | Freq: Every day | ORAL | 3 refills | Status: DC

## 2019-01-08 MED ORDER — DULOXETINE HCL 60 MG PO CPEP: 60.0000 mg | ORAL_CAPSULE | Freq: Every day | ORAL | 2 refills | Status: DC

## 2019-01-08 NOTE — Patient Instructions (Signed)
Preventive Care 40-64 Years, Female Preventive care refers to lifestyle choices and visits with your health care provider that can promote health and wellness. What does preventive care include?   A yearly physical exam. This is also called an annual well check.  Dental exams once or twice a year.  Routine eye exams. Ask your health care provider how often you should have your eyes checked.  Personal lifestyle choices, including: ? Daily care of your teeth and gums. ? Regular physical activity. ? Eating a healthy diet. ? Avoiding tobacco and drug use. ? Limiting alcohol use. ? Practicing safe sex. ? Taking low-dose aspirin daily starting at age 50. ? Taking vitamin and mineral supplements as recommended by your health care provider. What happens during an annual well check? The services and screenings done by your health care provider during your annual well check will depend on your age, overall health, lifestyle risk factors, and family history of disease. Counseling Your health care provider may ask you questions about your:  Alcohol use.  Tobacco use.  Drug use.  Emotional well-being.  Home and relationship well-being.  Sexual activity.  Eating habits.  Work and work environment.  Method of birth control.  Menstrual cycle.  Pregnancy history. Screening You may have the following tests or measurements:  Height, weight, and BMI.  Blood pressure.  Lipid and cholesterol levels. These may be checked every 5 years, or more frequently if you are over 50 years old.  Skin check.  Lung cancer screening. You may have this screening every year starting at age 55 if you have a 30-pack-year history of smoking and currently smoke or have quit within the past 15 years.  Colorectal cancer screening. All adults should have this screening starting at age 50 and continuing until age 75. Your health care provider may recommend screening at age 45. You will have tests every  1-10 years, depending on your results and the type of screening test. People at increased risk should start screening at an earlier age. Screening tests may include: ? Guaiac-based fecal occult blood testing. ? Fecal immunochemical test (FIT). ? Stool DNA test. ? Virtual colonoscopy. ? Sigmoidoscopy. During this test, a flexible tube with a tiny camera (sigmoidoscope) is used to examine your rectum and lower colon. The sigmoidoscope is inserted through your anus into your rectum and lower colon. ? Colonoscopy. During this test, a long, thin, flexible tube with a tiny camera (colonoscope) is used to examine your entire colon and rectum.  Hepatitis C blood test.  Hepatitis B blood test.  Sexually transmitted disease (STD) testing.  Diabetes screening. This is done by checking your blood sugar (glucose) after you have not eaten for a while (fasting). You may have this done every 1-3 years.  Mammogram. This may be done every 1-2 years. Talk to your health care provider about when you should start having regular mammograms. This may depend on whether you have a family history of breast cancer.  BRCA-related cancer screening. This may be done if you have a family history of breast, ovarian, tubal, or peritoneal cancers.  Pelvic exam and Pap test. This may be done every 3 years starting at age 21. Starting at age 30, this may be done every 5 years if you have a Pap test in combination with an HPV test.  Bone density scan. This is done to screen for osteoporosis. You may have this scan if you are at high risk for osteoporosis. Discuss your test results, treatment options,   and if necessary, the need for more tests with your health care provider. Vaccines Your health care provider may recommend certain vaccines, such as:  Influenza vaccine. This is recommended every year.  Tetanus, diphtheria, and acellular pertussis (Tdap, Td) vaccine. You may need a Td booster every 10 years.  Varicella  vaccine. You may need this if you have not been vaccinated.  Zoster vaccine. You may need this after age 38.  Measles, mumps, and rubella (MMR) vaccine. You may need at least one dose of MMR if you were born in 1957 or later. You may also need a second dose.  Pneumococcal 13-valent conjugate (PCV13) vaccine. You may need this if you have certain conditions and were not previously vaccinated.  Pneumococcal polysaccharide (PPSV23) vaccine. You may need one or two doses if you smoke cigarettes or if you have certain conditions.  Meningococcal vaccine. You may need this if you have certain conditions.  Hepatitis A vaccine. You may need this if you have certain conditions or if you travel or work in places where you may be exposed to hepatitis A.  Hepatitis B vaccine. You may need this if you have certain conditions or if you travel or work in places where you may be exposed to hepatitis B.  Haemophilus influenzae type b (Hib) vaccine. You may need this if you have certain conditions. Talk to your health care provider about which screenings and vaccines you need and how often you need them. This information is not intended to replace advice given to you by your health care provider. Make sure you discuss any questions you have with your health care provider. Document Released: 08/26/2015 Document Revised: 09/19/2017 Document Reviewed: 05/31/2015 Elsevier Interactive Patient Education  2019 [employer].

## 2019-01-08 NOTE — Progress Notes (Signed)
Name: Shari Long   MRN: [id number]    DOB: December 10, 1960   Date:01/08/2019       Progress Note  Subjective  Chief Complaint  Chief Complaint  Patient presents with  . Annual Exam    HPI   Patient presents for annual CPE and follow up  Dyslipidemia:low HDL and high LDL, discussed life style modification. She prefers not taking medication for now. We will recheck labs  Hypothyroidism: she is taking Synthroid, always feels tired, no change in bowel movement, mild dry skin We will recheck labs, today .   Obesity: she states she skips breakfast, does not eat a lot during the day because carbs makes her sleepy but she overeats at night. Discussed carbohydrate restrictive diet She lost 6 lbs in the past 6 months.   GERD: symptoms resolved when she stopped drinking diet drinks.Not taking any medication in months.   HTN: she is taking medication and denies side effects, no chest pain, palpitation.She states dizziness resolved, still taking one pill daily. BP is elevated today, she did not take medication this am, explained to take it with water prior to office visits.    Lumbar radiculitis: she states recently having more pain on left side of back and intermittent numbness that goes down to left foot intermittently. She has a history of DDD lumbar spine, never had surgery. She has seen chiropractor in the past, takes ibuprofen prn   Major Depression: she states her symptoms are usuallyworse during the winter, but she weaned self off Wellbutrin slowly last Summer and does not want to resume medication. Phq9 is worse, she also has aches and pains, discussed trying Duloxetine . She states working from home has made her feel worse. She has a long history of depression.   OSA: she was unable to tolerate CPAP machine. She is always tired during the day, explained that CPAP can help with fatigue   Diet: still skips food during the day , and snacks at night  Exercise: not much at this  time, she has a stationary bike but not using at this time  USPSTF grade A and B recommendations    Office Visit from 01/08/2019 in [facility]  AUDIT-C Score  1     Depression: Phq 9 is  negative Depression screen [facility] 2/9 01/08/2019 04/08/2018 10/02/2017 10/02/2016 03/29/2016  Decreased Interest 1 0 1 0 0  Down, Depressed, Hopeless 1 1 0 0 0  PHQ - 2 Score 2 1 1  0 0  Altered sleeping 1 1 1  - -  Tired, decreased energy 3 1 3  - -  Change in appetite 1 1 2  - -  Feeling bad or failure about yourself  1 1 0 - -  Trouble concentrating 0 1 1 - -  Moving slowly or fidgety/restless 0 0 0 - -  Suicidal thoughts 0 0 0 - -  PHQ-9 Score 8 6 8  - -  Difficult doing work/chores Somewhat difficult Not difficult at all Somewhat difficult - -   Hypertension: BP Readings from Last 3 Encounters:  01/08/19 (!) 140/98  04/08/18 138/88  10/02/17 122/80   Obesity: Wt Readings from Last 3 Encounters:  01/08/19 213 lb 1.6 oz (96.7 kg)  04/08/18 219 lb 12.8 oz (99.7 kg)  10/02/17 214 lb 11.2 oz (97.4 kg)   BMI Readings from Last 3 Encounters:  01/08/19 38.36 kg/m  04/08/18 37.73 kg/m  10/02/17 36.85 kg/m    Hep C Screening: 2017 STD testing and prevention (  HIV/chl/gon/syphilis): N/A Intimate partner violence: negative screen  Sexual History/Pain during Intercourse: not sexually active - husband has ED Menstrual History/LMP/Abnormal Bleeding: hysterectomy  Incontinence Symptoms: doing better now   Advanced Care Planning: A voluntary discussion about advance care planning including the explanation and discussion of advance directives.  Discussed health care proxy and Living will, and the patient was able to identify a health care proxy as her oldest son .  Patient does not have a living will at present time.   Breast cancer: up to date 2019 BRCA gene screening:  Two uncles with breast cancer, tested and negative Cervical cancer screening: N/A, s/p hysterectomy   Osteoporosis  Screening: discussed high calcium and vitamin D diet for now, check with insurance if bone density is covered   Lipids:  Lab Results  Component Value Date   CHOL 223 (H) 04/08/2018   CHOL 207 (H) 04/01/2017   CHOL 210 (H) 03/29/2016   Lab Results  Component Value Date   HDL 45 (L) 04/08/2018   HDL 46 (L) 04/01/2017   HDL 52 03/29/2016   Lab Results  Component Value Date   LDLCALC 149 (H) 04/08/2018   LDLCALC 138 (H) 04/01/2017   LDLCALC 135 (H) 03/29/2016   Lab Results  Component Value Date   TRIG 157 (H) 04/08/2018   TRIG 114 04/01/2017   TRIG 114 03/29/2016   Lab Results  Component Value Date   CHOLHDL 5.0 (H) 04/08/2018   CHOLHDL 4.5 04/01/2017   CHOLHDL 4.0 03/29/2016   No results found for: LDLDIRECT  Glucose:  Glucose  Date Value Ref Range Status  10/14/2013 83 65 - 99 mg/dL Final   Glucose, Bld  Date Value Ref Range Status  04/08/2018 90 65 - 99 mg/dL Final    Comment:    .            Fasting reference interval .   04/01/2017 89 65 - 99 mg/dL Final  03/29/2016 94 65 - 99 mg/dL Final    Skin cancer: discussed atypical lesions Colorectal cancer: repeat 2021 Lung cancer:   Low Dose CT Chest recommended if Age 65-80 years, 30 pack-year currently smoking OR have quit w/in 15years. Patient does not qualify.   ECG: 2012   Patient Active Problem List   Diagnosis Date Noted  . OSA (obstructive sleep apnea) 04/01/2017  . Dyslipidemia 10/02/2016  . Morbid obesity ([place]) 10/02/2016  . Hypothyroidism due to Hashimoto's thyroiditis 03/29/2016  . Major depression in remission ([place]) 03/29/2016  . GERD without esophagitis 03/29/2016  . Status post vaginal hysterectomy 11/24/2015  . Vaginal atrophy 11/24/2015  . HTN (hypertension) 12/25/2010    Past Surgical History:  Procedure Laterality Date  . CARDIAC CATHETERIZATION    . FOOT SURGERY  1978   left foot   . TONSILLECTOMY    . VAGINAL HYSTERECTOMY  2002    Family History  Problem Relation Age of  Onset  . Cancer - Cervical Mother   . Diabetes Mother   . Thyroid disease Sister   . Breast cancer Paternal Uncle 42  . Colon cancer Paternal Uncle   . Breast cancer Paternal Grandmother 71  . Breast cancer Cousin        ovarian (50's), breast(50's) and uterine(60's) cancer on maternal side.   . Ovarian cancer Cousin   . Heart disease Maternal Grandmother   . Heart disease Paternal Grandfather   . Breast cancer Other        great pat aunts x 2  .  Breast cancer Other        great pat uncle    Social History   Socioeconomic History  . Marital status: Married    Spouse name: Richardson Landry  . Number of children: 2  . Years of education: Not on file  . Highest education level: Master's degree (e.g., MA, MS, MEng, MEd, MSW, MBA)  Occupational History  . 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 Financial resource strain: Not hard at all  . Food insecurity:    Worry: Never true    Inability: Never true  . Transportation needs:    Medical: No    Non-medical: No  Tobacco Use  . Smoking status: Never Smoker  . Smokeless tobacco: Never Used  Substance and Sexual Activity  . Alcohol use: Yes    Alcohol/week: 1.0 standard drinks    Types: 1 Standard drinks or equivalent per week    Comment: rare  . Drug use: No  . Sexual activity: Yes    Partners: Male    Birth control/protection: Surgical  Lifestyle  . Physical activity:    Days per week: 3 days    Minutes per session: 20 min  . Stress: Not on file  Relationships  . Social connections:    Talks on phone: More than three times a week    Gets together: More than three times a week    Attends religious service: More than 4 times per year    Active member of club or organization: No    Attends meetings of clubs or organizations: Never    Relationship status: Married  . Intimate partner violence:    Fear of current or ex partner: No    Emotionally abused: No    Physically abused: No     Forced sexual activity: No  Other Topics Concern  . Not on file  Social History Narrative   She was separated from husband in 2005 but reconciled .    Works as Marine scientist at [employer]   Two grown sons and two grandchildren.      Current Outpatient Medications:  .  aspirin 81 MG tablet, Take 81 mg by mouth daily.  , Disp: , Rfl:  .  calcium citrate-vitamin D 200-200 MG-UNIT TABS, Take 1 tablet by mouth daily.  , Disp: , Rfl:  .  fluticasone (FLONASE) 50 MCG/ACT nasal spray, Place 2 sprays into both nostrils daily. (Patient taking differently: Place 2 sprays into both nostrils as needed. ), Disp: 16 g, Rfl: 6 .  levothyroxine (SYNTHROID, LEVOTHROID) 125 MCG tablet, Take 1 tablet (125 mcg total) by mouth daily before breakfast., Disp: 90 tablet, Rfl: 1 .  loratadine (CLARITIN) 10 MG tablet, Take 10 mg by mouth daily., Disp: , Rfl:  .  Omega-3 Fatty Acids (FISH OIL) 1000 MG CAPS, Take by mouth.  , Disp: , Rfl:  .  valsartan-hydrochlorothiazide (DIOVAN-HCT) 160-12.5 MG tablet, Take 1 tablet by mouth daily., Disp: 90 tablet, Rfl: 3 .  vitamin E 400 UNIT capsule, Take 400 Units by mouth daily.  , Disp: , Rfl:  .  DULoxetine (CYMBALTA) 60 MG capsule, Take 1 capsule (60 mg total) by mouth daily., Disp: 30 capsule, Rfl: 2  Allergies  Allergen Reactions  . Penicillins Rash     ROS  Constitutional: Negative for fever or weight change.  Respiratory: Negative for cough and shortness of breath.   Cardiovascular: Negative for chest pain or palpitations.  Gastrointestinal: Negative for abdominal pain, no bowel changes.  Musculoskeletal: Negative for gait problem or joint swelling.  Skin: Negative for rash.  Neurological: Negative for dizziness or headache.  No other specific complaints in a complete review of systems (except as listed in HPI above).  Objective   Vitals:   01/08/19 0806 01/08/19 0907  BP: 140/82 (!) 140/98  Pulse: 97   Resp: 16   Temp: 98.1 F (36.7 C)   TempSrc: Oral    SpO2: 98%   Weight: 213 lb 1.6 oz (96.7 kg)   Height: 5' 2.5" (1.588 m)     Body mass index is 38.36 kg/m.  Physical Exam  Constitutional: Patient appears well-developed and well-nourished. No distress.  HENT: Head: Normocephalic and atraumatic. Ears: B TMs ok, no erythema or effusion; Nose: Nose normal. Mouth/Throat: Oropharynx is clear and moist. No oropharyngeal exudate.  Eyes: Conjunctivae and EOM are normal. Pupils are equal, round, and reactive to light. No scleral icterus.  Neck: Normal range of motion. Neck supple. No JVD present. No thyromegaly present.  Cardiovascular: Normal rate, regular rhythm and normal heart sounds.  No murmur heard. No BLE edema. Pulmonary/Chest: Effort normal and breath sounds normal. No respiratory distress. Abdominal: Soft. Bowel sounds are normal, no distension. There is no tenderness. no masses Breast: no lumps or masses, no nipple discharge or rashes FEMALE GENITALIA:  Not done RECTAL: not done Musculoskeletal: Normal range of motion, no joint effusions. No gross deformities Neurological: he is alert and oriented to person, place, and time. No cranial nerve deficit. Coordination, balance, strength, speech and gait are normal.  Skin: Skin is warm and dry. No rash noted. No erythema.  Psychiatric: Patient has a normal mood and affect. behavior is normal. Judgment and thought content normal.   Recent Results (from the past 2160 hour(s))  TSH     Status: None   Collection Time: 10/14/18  8:04 AM  Result Value Ref Range   TSH 2.04 0.40 - 4.50 mIU/L     PHQ2/9: Depression screen [facility] 2/9 01/08/2019 04/08/2018 10/02/2017 10/02/2016 03/29/2016  Decreased Interest 1 0 1 0 0  Down, Depressed, Hopeless 1 1 0 0 0  PHQ - 2 Score 2 1 1  0 0  Altered sleeping 1 1 1  - -  Tired, decreased energy 3 1 3  - -  Change in appetite 1 1 2  - -  Feeling bad or failure about yourself  1 1 0 - -  Trouble concentrating 0 1 1 - -  Moving slowly or fidgety/restless 0 0  0 - -  Suicidal thoughts 0 0 0 - -  PHQ-9 Score 8 6 8  - -  Difficult doing work/chores Somewhat difficult Not difficult at all Somewhat difficult - -     Fall Risk: Fall Risk  01/08/2019 04/08/2018 10/02/2017 10/02/2016 03/29/2016  Falls in the past year? 0 No No No No  Number falls in past yr: 0 - - - -  Injury with Fall? 0 - - - -     Functional Status Survey: Is the patient deaf or have difficulty hearing?: No Does the patient have difficulty seeing, even when wearing glasses/contacts?: No Does the patient have difficulty concentrating, remembering, or making decisions?: No Does the patient have difficulty walking or climbing stairs?: No Does the patient have difficulty dressing or bathing?: No Does the patient have difficulty doing errands alone such as visiting a doctor's office or shopping?: No   Assessment & Plan  1. Essential hypertension  -  COMPLETE METABOLIC PANEL WITH GFR - CBC with Differential/Platelet - valsartan-hydrochlorothiazide (DIOVAN-HCT) 160-12.5 MG tablet; Take 1 tablet by mouth daily.  Dispense: 90 tablet; Refill: 3  2. Morbid obesity ([place])  Discussed with the patient the risk posed by an increased BMI. Discussed importance of portion control, calorie counting and at least 150 minutes of physical activity weekly. Avoid sweet beverages and drink more water. Eat at least 6 servings of fruit and vegetables daily   3. Mild episode of recurrent major depressive disorder (HCC)  - DULoxetine (CYMBALTA) 60 MG capsule; Take 1 capsule (60 mg total) by mouth daily.  Dispense: 30 capsule; Refill: 2  4. Hypothyroidism due to Hashimoto's thyroiditis  - TSH  5. OSA (obstructive sleep apnea)  Cannot tolerate CPAP   6. GERD without esophagitis   7. Other fatigue   8. Dyslipidemia  - Lipid panel  9. Left lumbar radiculitis  Try duloxetine   10. Diabetes mellitus screening  - Hemoglobin A1c  11. Well woman exam  - Lipid panel - COMPLETE METABOLIC  PANEL WITH GFR - CBC with Differential/Platelet - TSH - Hemoglobin A1c  12. Breast cancer screening  - MM Digital Screening; Future  -USPSTF grade A and B recommendations reviewed with patient; age-appropriate recommendations, preventive care, screening tests, etc discussed and encouraged; healthy living encouraged; see AVS for patient education given to patient -Discussed importance of 150 minutes of physical activity weekly, eat two servings of fish weekly, eat one serving of tree nuts ( cashews, pistachios, pecans, almonds.Marland Kitchen) every other day, eat 6 servings of fruit/vegetables daily and drink plenty of water and avoid sweet beverages.

## 2019-01-09 LAB — COMPLETE METABOLIC PANEL WITH GFR
AG Ratio: 1.6 (calc) (ref 1.0–2.5)
ALT: 26 U/L (ref 6–29)
AST: 35 U/L (ref 10–35)
Albumin: 4.5 g/dL (ref 3.6–5.1)
Alkaline phosphatase (APISO): 103 U/L (ref 37–153)
BUN: 7 mg/dL (ref 7–25)
CO2: 24 mmol/L (ref 20–32)
Calcium: 10 mg/dL (ref 8.6–10.4)
Chloride: 102 mmol/L (ref 98–110)
Creat: 0.95 mg/dL (ref 0.50–1.05)
GFR, Est African American: 76 mL/min/{1.73_m2} (ref >=60)
GFR, Est Non African American: 66 mL/min/{1.73_m2} (ref >=60)
Globulin: 2.8 g/dL (calc) (ref 1.9–3.7)
Glucose, Bld: 110 mg/dL — ABNORMAL HIGH (ref 65–99)
Potassium: 3.9 mmol/L (ref 3.5–5.3)
Sodium: 137 mmol/L (ref 135–146)
Total Bilirubin: 0.3 mg/dL (ref 0.2–1.2)
Total Protein: 7.3 g/dL (ref 6.1–8.1)

## 2019-01-09 LAB — CBC WITH DIFFERENTIAL/PLATELET
Absolute Monocytes: 521 cells/uL (ref 200–950)
Basophils Absolute: 62 cells/uL (ref 0–200)
Basophils Relative: 1.1 %
Eosinophils Absolute: 190 cells/uL (ref 15–500)
Eosinophils Relative: 3.4 %
HCT: 46.3 % — ABNORMAL HIGH (ref 35.0–45.0)
Hemoglobin: 15.3 g/dL (ref 11.7–15.5)
Lymphs Abs: 1669 cells/uL (ref 850–3900)
MCH: 28.5 pg (ref 27.0–33.0)
MCHC: 33 g/dL (ref 32.0–36.0)
MCV: 86.4 fL (ref 80.0–100.0)
MPV: 9.4 fL (ref 7.5–12.5)
Monocytes Relative: 9.3 %
Neutro Abs: 3158 cells/uL (ref 1500–7800)
Neutrophils Relative %: 56.4 %
Platelets: 384 10*3/uL (ref 140–400)
RBC: 5.36 10*6/uL — ABNORMAL HIGH (ref 3.80–5.10)
RDW: 13.6 % (ref 11.0–15.0)
Total Lymphocyte: 29.8 %
WBC: 5.6 10*3/uL (ref 3.8–10.8)

## 2019-01-09 LAB — HEMOGLOBIN A1C
Hgb A1c MFr Bld: 5.3 % of total Hgb (ref <5.7)
Mean Plasma Glucose: 105 (calc)
eAG (mmol/L): 5.8 (calc)

## 2019-01-09 LAB — LIPID PANEL
Cholesterol: 246 mg/dL — ABNORMAL HIGH (ref <200)
HDL: 46 mg/dL — ABNORMAL LOW (ref >=50)
LDL Cholesterol (Calc): 170 mg/dL (calc) — ABNORMAL HIGH
Non-HDL Cholesterol (Calc): 200 mg/dL (calc) — ABNORMAL HIGH (ref <130)
Total CHOL/HDL Ratio: 5.3 (calc) — ABNORMAL HIGH (ref <5.0)
Triglycerides: 156 mg/dL — ABNORMAL HIGH (ref <150)

## 2019-01-09 LAB — TSH: TSH: 0.22 mIU/L — ABNORMAL LOW (ref 0.40–4.50)

## 2019-01-12 ENCOUNTER — Encounter: Payer: Self-pay | Admitting: Family Medicine

## 2019-01-12 ENCOUNTER — Other Ambulatory Visit: Payer: Self-pay | Admitting: Family Medicine

## 2019-01-12 DIAGNOSIS — E038 Other specified hypothyroidism: Secondary | ICD-10-CM

## 2019-01-12 MED ORDER — LEVOTHYROXINE SODIUM 125 MCG PO TABS: 125.0000 ug | ORAL_TABLET | Freq: Every day | ORAL | 0 refills | Status: DC

## 2019-03-26 ENCOUNTER — Encounter: Payer: Self-pay | Admitting: Family Medicine

## 2019-03-26 ENCOUNTER — Ambulatory Visit (INDEPENDENT_AMBULATORY_CARE_PROVIDER_SITE_OTHER): Payer: 59 | Admitting: Family Medicine

## 2019-03-26 DIAGNOSIS — R21 Rash and other nonspecific skin eruption: Secondary | ICD-10-CM | POA: Diagnosis not present

## 2019-03-26 MED ORDER — VALACYCLOVIR HCL 1 G PO TABS: 1000.0000 mg | ORAL_TABLET | Freq: Three times a day (TID) | ORAL | 0 refills | Status: DC

## 2019-03-26 MED ORDER — TRIAMCINOLONE ACETONIDE 0.1 % EX CREA: 1.0000 "application " | TOPICAL_CREAM | Freq: Two times a day (BID) | CUTANEOUS | 0 refills | Status: DC

## 2019-03-26 NOTE — Progress Notes (Signed)
Name: Shari Long   MRN: [id number]    DOB: Jun 18, 1961   Date:03/26/2019       Progress Note  Subjective  Chief Complaint  Chief Complaint  Patient presents with  . Rash    felt rash on Tuesday night. Rash started itching last night. Someone thought that rash might be shingles. She has just rash that itches and burns.    I connected with  Shari SaleKaren S Childers  on 03/26/19 at 11:40 AM EDT by a video enabled telemedicine application and verified that I am speaking with the correct person using two identifiers.  I discussed the limitations of evaluation and management by telemedicine and the availability of in person appointments. The patient expressed understanding and agreed to proceed. Staff also discussed with the patient that there may be a patient responsible charge related to this service. Patient Location: at home  Provider Location: [facility]   HPI  Rash : she states rash developed an itchy rash on her mid back with pruritis migrating to the right flank, also has some burning sensation, she has noticed mild headache and waves of nausea, but no fever or fatigue. She cannot show me the rash through video call but will send me pics.She has been also noticing tingling on left foot and also hands intermittently but it is a chronic problem and we will discuss that on her upcoming in office visit. 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    Patient Active Problem List   Diagnosis Date Noted  . OSA (obstructive sleep apnea) 04/01/2017  . Dyslipidemia 10/02/2016  . Morbid obesity (HCC) 10/02/2016  . Hypothyroidism due to Hashimoto's thyroiditis 03/29/2016  . Major depression in remission (HCC) 03/29/2016  . GERD without esophagitis 03/29/2016  . Status post vaginal hysterectomy 11/24/2015  . Vaginal atrophy 11/24/2015  . HTN (hypertension) 12/25/2010    Past Surgical History:  Procedure Laterality Date  . CARDIAC CATHETERIZATION     . FOOT SURGERY  1978   left foot   . TONSILLECTOMY    . VAGINAL HYSTERECTOMY  2002    Family History  Problem Relation Age of Onset  . Cancer - Cervical Mother   . Diabetes Mother   . Thyroid disease Sister   . Breast cancer Paternal Uncle [id number]  . Colon cancer Paternal Uncle   . Breast cancer Paternal Grandmother [id number]  . Breast cancer Cousin        ovarian (50's), breast(50's) and uterine(60's) cancer on maternal side.   . Ovarian cancer Cousin   . Heart disease Maternal Grandmother   . Heart disease Paternal Grandfather   . Breast cancer Other        great pat aunts x 2  . Breast cancer Other        great pat uncle    Social History   Socioeconomic History  . Marital status: Married    Spouse name: Brett CanalesSteve  . Number of children: 2  . Years of education: Not on file  . Highest education level: Master's degree (e.g., MA, MS, MEng, MEd, MSW, MBA)  Occupational History  . Occupation: Producer, television/film/videonurse     Employer: [employer]    Comment: cancer center  Social Needs  . Financial resource strain: Not hard at all  . Food insecurity    Worry: Never true    Inability: Never true  . Transportation needs    Medical: No    Non-medical: No  Tobacco  Use  . Smoking status: Never Smoker  . Smokeless tobacco: Never Used  Substance and Sexual Activity  . Alcohol use: Yes    Alcohol/week: 1.0 standard drinks    Types: 1 Standard drinks or equivalent per week    Comment: rare  . Drug use: No  . Sexual activity: Yes    Partners: Male    Birth control/protection: Surgical  Lifestyle  . Physical activity    Days per week: 3 days    Minutes per session: 20 min  . Stress: Not on file  Relationships  . Social connections    Talks on phone: More than three times a week    Gets together: More than three times a week    Attends religious service: More than 4 times per year    Active member of club or organization: No    Attends meetings of clubs or organizations: Never    Relationship status:  Married  . Intimate partner violence    Fear of current or ex partner: No    Emotionally abused: No    Physically abused: No    Forced sexual activity: No  Other Topics Concern  . Not on file  Social History Narrative   She was separated from husband in 2005 but reconciled .    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      Current Outpatient Medications:  .  aspirin 81 MG tablet, Take 81 mg by mouth daily.  , Disp: , Rfl:  .  calcium citrate-vitamin D 200-200 MG-UNIT TABS, Take 1 tablet by mouth daily.  , Disp: , Rfl:  .  fluticasone (FLONASE) 50 MCG/ACT nasal spray, Place 2 sprays into both nostrils daily. (Patient taking differently: Place 2 sprays into both nostrils as needed. ), Disp: 16 g, Rfl: 6 .  levothyroxine (SYNTHROID) 125 MCG tablet, Take 1 tablet (125 mcg total) by mouth daily before breakfast. Only half on Sunday, Disp: 90 tablet, Rfl: 0 .  Omega-3 Fatty Acids (FISH OIL) 1000 MG CAPS, Take by mouth.  , Disp: , Rfl:  .  valsartan-hydrochlorothiazide (DIOVAN-HCT) 160-12.5 MG tablet, Take 1 tablet by mouth daily., Disp: 90 tablet, Rfl: 3 .  vitamin E 400 UNIT capsule, Take 400 Units by mouth daily.  , Disp: , Rfl:  .  DULoxetine (CYMBALTA) 60 MG capsule, Take 1 capsule (60 mg total) by mouth daily. (Patient not taking: Reported on 03/26/2019), Disp: 30 capsule, Rfl: 2 .  loratadine (CLARITIN) 10 MG tablet, Take 10 mg by mouth daily., Disp: , Rfl:   Allergies  Allergen Reactions  . Penicillins Rash    I personally reviewed active problem list, medication list, allergies, family history, social history with the patient/caregiver today.   ROS  Ten systems reviewed and is negative except as mentioned in HPI   Objective  Virtual encounter, vitals not obtained.  There is no height or weight on file to calculate BMI.  Physical Exam  Awake, alert and oriented  Rash: 5-6 lesions on mid back, base is not erythematous and not cropped  together, only one seems to have a blister  PHQ2/9: [facility] 03/26/2019 01/08/2019 04/08/2018 10/02/2017 10/02/2016  Decreased Interest 0 1 0 1 0  Down, Depressed, Hopeless 1 1 1  0 0  PHQ - 2 Score 1 2 1 1  0  Altered sleeping 0 1 1 1  -  Tired, decreased energy 3 3 1 3  -  Change in appetite  0 1 1 2  -  Feeling bad or failure about yourself  0 1 1 0 -  Trouble concentrating 1 0 1 1 -  Moving slowly or fidgety/restless 0 0 0 0 -  Suicidal thoughts 0 0 0 0 -  PHQ-9 Score 5 8 6 8  -  Difficult doing work/chores Not difficult at all Somewhat difficult Not difficult at all Somewhat difficult -   PHQ-2/9 Result is positive.    Fall Risk: Fall Risk  03/26/2019 01/08/2019 04/08/2018 10/02/2017 10/02/2016  Falls in the past year? 0 0 No No No  Number falls in past yr: 0 0 - - -  Injury with Fall? 0 0 - - -     Assessment & Plan  1. Rash  - valACYclovir (VALTREX) 1000 MG tablet; Take 1 tablet (1,000 mg total) by mouth 3 (three) times daily.  Dispense: 30 tablet; Refill: 0 - triamcinolone cream (KENALOG) 0.1 %; Apply 1 application topically 2 (two) times daily.  Dispense: 30 g; Refill: 0  Explained to her that it does not seem to be shingles, it is mid line and not in a cluster, more itchy and not painful, but to be safe we will treat with Valtrex, worse remotely for the next few days and may also apply steroid cream   I discussed the assessment and treatment plan with the patient. The patient was provided an opportunity to ask questions and all were answered. The patient agreed with the plan and demonstrated an understanding of the instructions.  The patient was advised to call back or seek an in-person evaluation if the symptoms worsen or if the condition fails to improve as anticipated.  I provided 15  minutes of non-face-to-face time during this encounter.

## 2019-04-06 ENCOUNTER — Encounter: Payer: Self-pay | Admitting: Family Medicine

## 2019-04-06 ENCOUNTER — Ambulatory Visit (INDEPENDENT_AMBULATORY_CARE_PROVIDER_SITE_OTHER): Payer: 59 | Admitting: Family Medicine

## 2019-04-06 ENCOUNTER — Other Ambulatory Visit: Payer: Self-pay

## 2019-04-06 VITALS — BP 134/76 | HR 91 | Temp 97.6°F | Resp 16 | Ht 62.5 in | Wt 201.6 lb

## 2019-04-06 DIAGNOSIS — F330 Major depressive disorder, recurrent, mild: Secondary | ICD-10-CM

## 2019-04-06 DIAGNOSIS — R202 Paresthesia of skin: Secondary | ICD-10-CM | POA: Diagnosis not present

## 2019-04-06 DIAGNOSIS — E7800 Pure hypercholesterolemia, unspecified: Secondary | ICD-10-CM | POA: Diagnosis not present

## 2019-04-06 DIAGNOSIS — G4733 Obstructive sleep apnea (adult) (pediatric): Secondary | ICD-10-CM | POA: Diagnosis not present

## 2019-04-06 DIAGNOSIS — E038 Other specified hypothyroidism: Secondary | ICD-10-CM

## 2019-04-06 DIAGNOSIS — E063 Autoimmune thyroiditis: Secondary | ICD-10-CM | POA: Diagnosis not present

## 2019-04-06 DIAGNOSIS — R413 Other amnesia: Secondary | ICD-10-CM | POA: Diagnosis not present

## 2019-04-06 DIAGNOSIS — I10 Essential (primary) hypertension: Secondary | ICD-10-CM

## 2019-04-06 LAB — TSH: TSH: 0.76 mIU/L (ref 0.40–4.50)

## 2019-04-06 NOTE — Patient Instructions (Signed)
Research fibromyalgia syndrome and chronic fatigue

## 2019-04-06 NOTE — Progress Notes (Signed)
Name: Shari Long   MRN: [id number]    DOB: Oct 29, 1960   Date:04/06/2019       Progress Note  Subjective  Chief Complaint  Chief Complaint  Patient presents with  . Medication Refill  . Hypertension    Dizziness  . Hypothyroidism  . Dyslipidemia  . Depression    Could not tolerate Duloxetine-side effects made her dizzy to the point of not being able to function  . Sleep Apnea  . Fatigue    Unable to work out because she feels like she was hit by a train  . Gait Problem    Maybe to her left foot  . Numbness    Onset-several months-Left Foot will fall asleep easily and tingling in the bottom of her foot   . Memory Loss    Difficult concentrating and focus-struggle with memory    HPI  Dyslipidemia:low HDL and high LDL, discussed life style modification, she has changed her diet and is trying to lose weight.. She prefers not taking medication for now.She lost 12 lbs since last visit with Keto diet  The 10-year ASCVD risk score Mikey Bussing DC Jr., et al., 2013) is: 5.9%   Values used to calculate the score:     Age: 58 years     Sex: Female     Is Non-Hispanic African American: No     Diabetic: No     Tobacco smoker: No     Systolic Blood Pressure: 580 mmHg     Is BP treated: Yes     HDL Cholesterol: 46 mg/dL     Total Cholesterol: 246 mg/dL  Hypothyroidism: she is taking Synthroid, always feels tired, no change in bowel movement, mild dry skinLast TSH was suppressed and we will recheck it today   Obesity:.She is now doing great on diet, lost 12 lbs in the past 3 months   HTN: she is taking medication and denies side effects, no chest pain, palpitation.She is still having dizziness intermittently . BP is at goal today.  Lumbar radiculitis: she states recently having more pain on left side of back and intermittent numbness that goes down to left foot intermittently. She has a history of DDD lumbar spine, never had surgery. She has seen chiropractor in the past, takes  ibuprofen prn  Unchanged   Major Depression: she states her symptoms are usuallyworse during the winter, but she weaned self off Wellbutrin slowlylast Summer and does not want to resume medication. Phq9 is worse, she also has aches and pains, she took duloxetine very low dose for about one month, she states she had motivation, was able to work in her yard, but she states she had dizziness and stopped medication, she is feeling worse again. She has noticed difficulty concentrating   OSA: she was unable to tolerate CPAP machine. She is always tired during the day, explained that CPAP can help with fatigue and also memory and mental fogginess. Unchanged   Paresthesia: both arms throughout the day, she also has mental fogginess, fatigue at the end of the day, or after activity, she also has left foot pain intermittently ( she has a history of surgery on left foot also) symptoms going on for the past couple of years but seems worse recently. Discussed FMS and or chronic fatigue.   Patient Active Problem List   Diagnosis Date Noted  . OSA (obstructive sleep apnea) 04/01/2017  . Dyslipidemia 10/02/2016  . Morbid obesity ([place]) 10/02/2016  . Hypothyroidism due to Hashimoto's thyroiditis 03/29/2016  .  Major depression in remission (HCC) 03/29/2016  . GERD without esophagitis 03/29/2016  . Status post vaginal hysterectomy 11/24/2015  . Vaginal atrophy 11/24/2015  . HTN (hypertension) 12/25/2010    Past Surgical History:  Procedure Laterality Date  . CARDIAC CATHETERIZATION    . FOOT SURGERY  1978   left foot   . TONSILLECTOMY    . VAGINAL HYSTERECTOMY  2002    Family History  Problem Relation Age of Onset  . Cancer - Cervical Mother   . Diabetes Mother   . Thyroid disease Sister   . Breast cancer Paternal Uncle [id number]  . Colon cancer Paternal Uncle   . Breast cancer Paternal Grandmother [id number]  . Breast cancer Cousin        ovarian (50's), breast(50's) and uterine(60's) cancer on maternal  side.   . Ovarian cancer Cousin   . Heart disease Maternal Grandmother   . Heart disease Paternal Grandfather   . Breast cancer Other        great pat aunts x 2  . Breast cancer Other        great pat uncle    Social History   Socioeconomic History  . Marital status: Married    Spouse name: Brett CanalesSteve  . Number of children: 2  . Years of education: Not on file  . Highest education level: Master's degree (e.g., MA, MS, MEng, MEd, MSW, MBA)  Occupational History  . Occupation: Producer, television/film/videonurse     Employer: [employer]    Comment: cancer center  Social Needs  . Financial resource strain: Not hard at all  . Food insecurity    Worry: Never true    Inability: Never true  . Transportation needs    Medical: No    Non-medical: No  Tobacco Use  . Smoking status: Never Smoker  . Smokeless tobacco: Never Used  Substance and Sexual Activity  . Alcohol use: Yes    Alcohol/week: 1.0 standard drinks    Types: 1 Standard drinks or equivalent per week    Comment: rare  . Drug use: No  . Sexual activity: Yes    Partners: Male    Birth control/protection: Surgical  Lifestyle  . Physical activity    Days per week: 3 days    Minutes per session: 20 min  . Stress: Only a little  Relationships  . Social connections    Talks on phone: More than three times a week    Gets together: More than three times a week    Attends religious service: More than 4 times per year    Active member of club or organization: No    Attends meetings of clubs or organizations: Never    Relationship status: Married  . Intimate partner violence    Fear of current or ex partner: No    Emotionally abused: No    Physically abused: No    Forced sexual activity: No  Other Topics Concern  . Not on file  Social History Narrative   She was separated from husband in 2005 but reconciled .    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      Current Outpatient Medications:  .  aspirin 81 MG tablet,  Take 81 mg by mouth daily.  , Disp: , Rfl:  .  calcium citrate-vitamin D 200-200 MG-UNIT TABS, Take 1 tablet by mouth daily.  , Disp: , Rfl:  .  fluticasone (FLONASE) 50 MCG/ACT nasal spray, Place 2 sprays  into both nostrils daily. (Patient taking differently: Place 2 sprays into both nostrils as needed. ), Disp: 16 g, Rfl: 6 .  levothyroxine (SYNTHROID) 125 MCG tablet, Take 1 tablet (125 mcg total) by mouth daily before breakfast. Only half on Sunday, Disp: 90 tablet, Rfl: 0 .  Omega-3 Fatty Acids (FISH OIL) 1000 MG CAPS, Take by mouth.  , Disp: , Rfl:  .  valsartan-hydrochlorothiazide (DIOVAN-HCT) 160-12.5 MG tablet, Take 1 tablet by mouth daily., Disp: 90 tablet, Rfl: 3 .  vitamin E 400 UNIT capsule, Take 400 Units by mouth daily.  , Disp: , Rfl:  .  loratadine (CLARITIN) 10 MG tablet, Take 10 mg by mouth daily., Disp: , Rfl:  .  triamcinolone cream (KENALOG) 0.1 %, Apply 1 application topically 2 (two) times daily. (Patient not taking: Reported on 04/06/2019), Disp: 30 g, Rfl: 0 .  valACYclovir (VALTREX) 1000 MG tablet, Take 1 tablet (1,000 mg total) by mouth 3 (three) times daily. (Patient not taking: Reported on 04/06/2019), Disp: 30 tablet, Rfl: 0  Allergies  Allergen Reactions  . Penicillins Rash    I personally reviewed active problem list, medication list, allergies, family history, social history with the patient/caregiver today.   ROS  Constitutional: Negative for fever or weight change.  Respiratory: Negative for cough and shortness of breath.   Cardiovascular: Negative for chest pain or palpitations.  Gastrointestinal: Negative for abdominal pain, no bowel changes.  Musculoskeletal: Negative for gait problem or joint swelling.  Skin: Negative for rash.  Neurological: Positive for dizziness but no headache.  No other specific complaints in a complete review of systems (except as listed in HPI above).  Objective  Vitals:   04/06/19 1152  BP: 134/76  Pulse: 91  Resp: 16   Temp: 97.6 F (36.4 C)  TempSrc: Temporal  SpO2: 99%  Weight: 201 lb 9.6 oz (91.4 kg)  Height: 5' 2.5" (1.588 m)    Body mass index is 36.29 kg/m.  Physical Exam  Constitutional: Patient appears well-developed and well-nourished. Obese  No distress.  HEENT: head atraumatic, normocephalic, pupils equal and reactive to light,  neck supple, Cardiovascular: Normal rate, regular rhythm and normal heart sounds.  No murmur heard. non pitting  BLE edema. Pulmonary/Chest: Effort normal and breath sounds normal. No respiratory distress. Muscular Skeletal: trigger point positive  Abdominal: Soft.  There is no tenderness. Psychiatric: Patient has a normal mood and affect. behavior is normal. Judgment and thought content normal.  Recent Results (from the past 2160 hour(s))  Lipid panel     Status: Abnormal   Collection Time: 01/08/19  9:01 AM  Result Value Ref Range   Cholesterol 246 (H) <200 mg/dL   HDL 46 (L) > OR = 50 mg/dL   Triglycerides 161 (H) <150 mg/dL   LDL Cholesterol (Calc) 170 (H) mg/dL (calc)    Comment: Reference range: <100 . Desirable range <100 mg/dL for primary prevention;   <70 mg/dL for patients with CHD or diabetic patients  with > or = 2 CHD risk factors. Marland Kitchen LDL-C is now calculated using the Martin-Hopkins  calculation, which is a validated novel method providing  better accuracy than the Friedewald equation in the  estimation of LDL-C.  Horald Pollen et al. Lenox Ahr. 0960;454(09): 2061-2068  (http://education.[facility].com/faq/FAQ164)    Total CHOL/HDL Ratio 5.3 (H) <5.0 (calc)   Non-HDL Cholesterol (Calc) 200 (H) <130 mg/dL (calc)    Comment: For patients with diabetes plus 1 major ASCVD risk  factor, treating to a non-HDL-C goal of <  100 mg/dL  (LDL-C of <14<70 mg/dL) is considered a therapeutic  option.   COMPLETE METABOLIC PANEL WITH GFR     Status: Abnormal   Collection Time: 01/08/19  9:01 AM  Result Value Ref Range   Glucose, Bld 110 (H) 65 - 99 mg/dL     Comment: .            Fasting reference interval . For someone without known diabetes, a glucose value between 100 and 125 mg/dL is consistent with prediabetes and should be confirmed with a follow-up test. .    BUN 7 7 - 25 mg/dL   Creat 7.820.95 9.560.50 - 2.131.05 mg/dL    Comment: For patients >60 years of age, the reference limit for Creatinine is approximately 13% higher for people identified as African-American. .    GFR, Est Non African American 66 > OR = 60 mL/min/1.1073m2   GFR, Est African American 76 > OR = 60 mL/min/1.4873m2   BUN/Creatinine Ratio NOT APPLICABLE 6 - 22 (calc)   Sodium 137 135 - 146 mmol/L   Potassium 3.9 3.5 - 5.3 mmol/L   Chloride 102 98 - 110 mmol/L   CO2 24 20 - 32 mmol/L   Calcium 10.0 8.6 - 10.4 mg/dL   Total Protein 7.3 6.1 - 8.1 g/dL   Albumin 4.5 3.6 - 5.1 g/dL   Globulin 2.8 1.9 - 3.7 g/dL (calc)   AG Ratio 1.6 1.0 - 2.5 (calc)   Total Bilirubin 0.3 0.2 - 1.2 mg/dL   Alkaline phosphatase (APISO) 103 37 - 153 U/L   AST 35 10 - 35 U/L   ALT 26 6 - 29 U/L  CBC with Differential/Platelet     Status: Abnormal   Collection Time: 01/08/19  9:01 AM  Result Value Ref Range   WBC 5.6 3.8 - 10.8 Thousand/uL   RBC 5.36 (H) 3.80 - 5.10 Million/uL   Hemoglobin 15.3 11.7 - 15.5 g/dL   HCT 08.646.3 (H) 57.835.0 - 46.945.0 %   MCV 86.4 80.0 - 100.0 fL   MCH 28.5 27.0 - 33.0 pg   MCHC 33.0 32.0 - 36.0 g/dL   RDW 62.913.6 52.811.0 - 41.315.0 %   Platelets 384 140 - 400 Thousand/uL   MPV 9.4 7.5 - 12.5 fL   Neutro Abs 3,158 1,500 - 7,800 cells/uL   Lymphs Abs 1,669 850 - 3,900 cells/uL   Absolute Monocytes 521 200 - 950 cells/uL   Eosinophils Absolute 190 15 - 500 cells/uL   Basophils Absolute 62 0 - 200 cells/uL   Neutrophils Relative % 56.4 %   Total Lymphocyte 29.8 %   Monocytes Relative 9.3 %   Eosinophils Relative 3.4 %   Basophils Relative 1.1 %  TSH     Status: Abnormal   Collection Time: 01/08/19  9:01 AM  Result Value Ref Range   TSH 0.22 (L) 0.40 - 4.50 mIU/L   Hemoglobin A1c     Status: None   Collection Time: 01/08/19  9:01 AM  Result Value Ref Range   Hgb A1c MFr Bld 5.3 <5.7 % of total Hgb    Comment: For the purpose of screening for the presence of diabetes: . <5.7%       Consistent with the absence of diabetes 5.7-6.4%    Consistent with increased risk for diabetes             (prediabetes) > or =6.5%  Consistent with diabetes . This assay result is consistent with a decreased risk of diabetes. .Marland Kitchen  Currently, no consensus exists regarding use of hemoglobin A1c for diagnosis of diabetes in children. . According to American Diabetes Association (ADA) guidelines, hemoglobin A1c <7.0% represents optimal control in non-pregnant diabetic patients. Different metrics may apply to specific patient populations.  Standards of Medical Care in Diabetes(ADA). .    Mean Plasma Glucose 105 (calc)   eAG (mmol/L) 5.8 (calc)     PHQ2/9: Depression screen [facility]/9 04/06/2019 03/26/2019 01/08/2019 04/08/2018 10/02/2017  Decreased Interest 0 0 1 0 1  Down, Depressed, Hopeless 1 1 1 1  0  PHQ - 2 Score 1 1 2 1 1   Altered sleeping 0 0 1 1 1   Tired, decreased energy 3 3 3 1 3   Change in appetite 0 0 1 1 2   Feeling bad or failure about yourself  0 0 1 1 0  Trouble concentrating 1 1 0 1 1  Moving slowly or fidgety/restless 0 0 0 0 0  Suicidal thoughts 0 0 0 0 0  PHQ-9 Score 5 5 8 6 8   Difficult doing work/chores Somewhat difficult Not difficult at all Somewhat difficult Not difficult at all Somewhat difficult    phq 9 is positive   Fall Risk: Fall Risk  04/06/2019 03/26/2019 01/08/2019 04/08/2018 10/02/2017  Falls in the past year? 0 0 0 No No  Number falls in past yr: 0 0 0 - -  Injury with Fall? 0 0 0 - -    Assessment & Plan  1. Hypothyroidism due to Hashimoto's thyroiditis  - TSH  2. Essential hypertension  At goal   3. OSA (obstructive sleep apnea)  Not on CPAP, could not tolerate it   4. Mild episode of recurrent major depressive  disorder (HCC)  stable  5. Paresthesia  - Ambulatory referral to Neurology  6. Pure hypercholesterolemia  On life style modification   7. Memory changes  - Ambulatory referral to Neurology

## 2019-04-07 ENCOUNTER — Other Ambulatory Visit: Payer: Self-pay | Admitting: Family Medicine

## 2019-04-07 DIAGNOSIS — E038 Other specified hypothyroidism: Secondary | ICD-10-CM

## 2019-04-07 MED ORDER — LEVOTHYROXINE SODIUM 125 MCG PO TABS: 125.0000 ug | ORAL_TABLET | Freq: Every day | ORAL | 0 refills | Status: DC

## 2019-05-23 DIAGNOSIS — H169 Unspecified keratitis: Secondary | ICD-10-CM | POA: Diagnosis not present

## 2019-06-10 DIAGNOSIS — B0052 Herpesviral keratitis: Secondary | ICD-10-CM | POA: Diagnosis not present

## 2019-06-17 DIAGNOSIS — B0052 Herpesviral keratitis: Secondary | ICD-10-CM | POA: Diagnosis not present

## 2019-06-24 DIAGNOSIS — B0052 Herpesviral keratitis: Secondary | ICD-10-CM | POA: Diagnosis not present

## 2019-07-02 DIAGNOSIS — H40039 Anatomical narrow angle, unspecified eye: Secondary | ICD-10-CM | POA: Diagnosis not present

## 2019-07-07 ENCOUNTER — Ambulatory Visit: Payer: 59 | Admitting: Family Medicine

## 2019-08-10 DIAGNOSIS — B0052 Herpesviral keratitis: Secondary | ICD-10-CM | POA: Diagnosis not present

## 2019-08-12 DIAGNOSIS — H16232 Neurotrophic keratoconjunctivitis, left eye: Secondary | ICD-10-CM | POA: Diagnosis not present

## 2019-08-20 DIAGNOSIS — G4733 Obstructive sleep apnea (adult) (pediatric): Secondary | ICD-10-CM | POA: Diagnosis not present

## 2019-08-20 DIAGNOSIS — E531 Pyridoxine deficiency: Secondary | ICD-10-CM | POA: Diagnosis not present

## 2019-08-20 DIAGNOSIS — Z8619 Personal history of other infectious and parasitic diseases: Secondary | ICD-10-CM | POA: Diagnosis not present

## 2019-08-20 DIAGNOSIS — E559 Vitamin D deficiency, unspecified: Secondary | ICD-10-CM | POA: Diagnosis not present

## 2019-08-20 DIAGNOSIS — R200 Anesthesia of skin: Secondary | ICD-10-CM | POA: Diagnosis not present

## 2019-08-20 DIAGNOSIS — E519 Thiamine deficiency, unspecified: Secondary | ICD-10-CM | POA: Diagnosis not present

## 2019-08-20 DIAGNOSIS — E538 Deficiency of other specified B group vitamins: Secondary | ICD-10-CM | POA: Diagnosis not present

## 2019-08-21 DIAGNOSIS — H16232 Neurotrophic keratoconjunctivitis, left eye: Secondary | ICD-10-CM | POA: Diagnosis not present

## 2019-08-26 ENCOUNTER — Other Ambulatory Visit: Payer: Self-pay | Admitting: Neurology

## 2019-08-26 ENCOUNTER — Other Ambulatory Visit (HOSPITAL_COMMUNITY): Payer: Self-pay | Admitting: Neurology

## 2019-08-26 DIAGNOSIS — H16232 Neurotrophic keratoconjunctivitis, left eye: Secondary | ICD-10-CM | POA: Diagnosis not present

## 2019-08-26 DIAGNOSIS — R200 Anesthesia of skin: Secondary | ICD-10-CM

## 2019-09-02 ENCOUNTER — Other Ambulatory Visit: Payer: Self-pay | Admitting: Family Medicine

## 2019-09-02 ENCOUNTER — Ambulatory Visit
Admission: RE | Admit: 2019-09-02 | Discharge: 2019-09-02 | Disposition: A | Discharge status: Home / Self Care | Payer: 59 | Source: Ambulatory Visit | Attending: Family Medicine | Admitting: Family Medicine

## 2019-09-02 DIAGNOSIS — Z1231 Encounter for screening mammogram for malignant neoplasm of breast: Secondary | ICD-10-CM | POA: Diagnosis not present

## 2019-09-02 DIAGNOSIS — R200 Anesthesia of skin: Secondary | ICD-10-CM | POA: Insufficient documentation

## 2019-09-02 DIAGNOSIS — Z1239 Encounter for other screening for malignant neoplasm of breast: Secondary | ICD-10-CM

## 2019-09-03 ENCOUNTER — Ambulatory Visit
Admission: RE | Admit: 2019-09-03 | Discharge: 2019-09-03 | Disposition: A | Discharge status: Home / Self Care | Payer: 59 | Source: Ambulatory Visit | Attending: Neurology | Admitting: Neurology

## 2019-09-03 ENCOUNTER — Other Ambulatory Visit: Payer: Self-pay

## 2019-09-03 DIAGNOSIS — M50221 Other cervical disc displacement at C4-C5 level: Secondary | ICD-10-CM | POA: Diagnosis not present

## 2019-09-03 DIAGNOSIS — R200 Anesthesia of skin: Secondary | ICD-10-CM | POA: Diagnosis not present

## 2019-09-03 DIAGNOSIS — Z1239 Encounter for other screening for malignant neoplasm of breast: Secondary | ICD-10-CM | POA: Diagnosis not present

## 2019-09-03 LAB — POCT I-STAT CREATININE: Creatinine, Ser: 0.7 mg/dL (ref 0.44–1.00)

## 2019-09-03 MED ORDER — GADOBUTROL 1 MMOL/ML IV SOLN
9.0000 mL | Freq: Once | INTRAVENOUS | Status: AC | PRN
  Administered 2019-09-03: 9 mL via INTRAVENOUS

## 2019-09-09 DIAGNOSIS — R200 Anesthesia of skin: Secondary | ICD-10-CM | POA: Diagnosis not present

## 2019-09-21 DIAGNOSIS — R200 Anesthesia of skin: Secondary | ICD-10-CM | POA: Diagnosis not present

## 2019-09-22 DIAGNOSIS — R200 Anesthesia of skin: Secondary | ICD-10-CM | POA: Diagnosis not present

## 2019-10-09 ENCOUNTER — Other Ambulatory Visit: Payer: Self-pay | Admitting: Family Medicine

## 2019-10-09 DIAGNOSIS — E038 Other specified hypothyroidism: Secondary | ICD-10-CM

## 2019-10-09 DIAGNOSIS — E063 Autoimmune thyroiditis: Secondary | ICD-10-CM

## 2019-10-14 ENCOUNTER — Other Ambulatory Visit: Payer: Self-pay | Admitting: Family Medicine

## 2019-10-14 DIAGNOSIS — E038 Other specified hypothyroidism: Secondary | ICD-10-CM

## 2019-10-21 DIAGNOSIS — E038 Other specified hypothyroidism: Secondary | ICD-10-CM | POA: Diagnosis not present

## 2019-10-21 DIAGNOSIS — E063 Autoimmune thyroiditis: Secondary | ICD-10-CM | POA: Diagnosis not present

## 2019-10-21 LAB — TSH: TSH: 2.38 mIU/L (ref 0.40–4.50)

## 2019-10-22 ENCOUNTER — Encounter: Payer: Self-pay | Admitting: Family Medicine

## 2019-10-22 ENCOUNTER — Other Ambulatory Visit: Payer: Self-pay | Admitting: Family Medicine

## 2019-10-22 DIAGNOSIS — E063 Autoimmune thyroiditis: Secondary | ICD-10-CM

## 2019-10-22 DIAGNOSIS — E038 Other specified hypothyroidism: Secondary | ICD-10-CM

## 2019-10-22 MED ORDER — LEVOTHYROXINE SODIUM 125 MCG PO TABS: 125.0000 ug | ORAL_TABLET | Freq: Every day | ORAL | 0 refills | Status: DC

## 2019-11-24 DIAGNOSIS — G608 Other hereditary and idiopathic neuropathies: Secondary | ICD-10-CM | POA: Diagnosis not present

## 2019-12-03 DIAGNOSIS — H16232 Neurotrophic keratoconjunctivitis, left eye: Secondary | ICD-10-CM | POA: Diagnosis not present

## 2019-12-08 ENCOUNTER — Encounter: Payer: Self-pay | Admitting: Family Medicine

## 2019-12-08 ENCOUNTER — Other Ambulatory Visit: Payer: Self-pay | Admitting: Family Medicine

## 2019-12-08 DIAGNOSIS — Z1211 Encounter for screening for malignant neoplasm of colon: Secondary | ICD-10-CM

## 2019-12-09 ENCOUNTER — Telehealth: Payer: Self-pay

## 2019-12-09 NOTE — Telephone Encounter (Signed)
Pt calling to schedule colonoscopy. PCP sent referral. Last colonoscopy 08/13/2009. Please cal pt to schedule. Monday's are best. Also June 1st pt available to do prep if nothing in May.

## 2019-12-14 ENCOUNTER — Other Ambulatory Visit: Payer: Self-pay

## 2019-12-15 ENCOUNTER — Other Ambulatory Visit: Payer: Self-pay

## 2019-12-15 ENCOUNTER — Telehealth (INDEPENDENT_AMBULATORY_CARE_PROVIDER_SITE_OTHER): Payer: Self-pay | Admitting: Gastroenterology

## 2019-12-15 DIAGNOSIS — Z1211 Encounter for screening for malignant neoplasm of colon: Secondary | ICD-10-CM

## 2019-12-15 NOTE — Progress Notes (Signed)
Gastroenterology Pre-Procedure Review  Request Date: Thursday 01/14/20 Requesting Physician: Dr. Servando Snare  PATIENT REVIEW QUESTIONS: The patient responded to the following health history questions as indicated:    1. Are you having any GI issues? yes (GI Bug) 2. Do you have a personal history of Polyps? no 3. Do you have a family history of Colon Cancer or Polyps? yes (paternal uncle colon cancer) 4. Diabetes Mellitus? no 5. Joint replacements in the past 12 months?no 6. Major health problems in the past 3 months?eye disorder 7. Any artificial heart valves, MVP, or defibrillator?no    MEDICATIONS & ALLERGIES:    Patient reports the following regarding taking any anticoagulation/antiplatelet therapy:   Plavix, Coumadin, Eliquis, Xarelto, Lovenox, Pradaxa, Brilinta, or Effient? no Aspirin? yes (81 mg daily)  Patient confirms/reports the following medications:  Current Outpatient Medications  Medication Sig Dispense Refill  . ascorbic acid (VITAMIN C) 1000 MG tablet Take by mouth.    Marland Kitchen ascorbic acid (VITAMIN C) 500 MG tablet     . aspirin 81 MG tablet Take 81 mg by mouth daily.      . calcium citrate-vitamin D 200-200 MG-UNIT TABS Take 1 tablet by mouth daily.      . fluticasone (FLONASE) 50 MCG/ACT nasal spray Place 2 sprays into both nostrils daily. (Patient taking differently: Place 2 sprays into both nostrils as needed. ) 16 g 6  . L-Lysine 1000 MG TABS Take by mouth.    . levothyroxine (SYNTHROID) 125 MCG tablet Take 1 tablet (125 mcg total) by mouth daily before breakfast. Only half on Sunday 90 tablet 0  . Multiple Vitamins tablet     . Omega-3 Fatty Acids (RA FISH OIL) 1000 MG CAPS Take by mouth.    . valACYclovir (VALTREX) 1000 MG tablet     . valsartan-hydrochlorothiazide (DIOVAN-HCT) 160-12.5 MG tablet Take 1 tablet by mouth daily. 90 tablet 3  . VIGAMOX 0.5 % ophthalmic solution     . vitamin E 400 UNIT capsule Take 400 Units by mouth daily.       No current  facility-administered medications for this visit.    Patient confirms/reports the following allergies:  Allergies  Allergen Reactions  . Penicillins Rash    No orders of the defined types were placed in this encounter.   AUTHORIZATION INFORMATION Primary Insurance: 1D#: Group #:  Secondary Insurance: 1D#: Group #:  SCHEDULE INFORMATION: Date: Thursday 01/14/20 Time: Location:[facility]

## 2019-12-15 NOTE — Progress Notes (Signed)
Gastroenterology Pre-Procedure Review  Request Date: Thursday 01/14/20 Requesting Physician: Dr. Servando Snare  PATIENT REVIEW QUESTIONS: The patient responded to the following health history questions as indicated:    1. Are you having any GI issues? yes (GI Bug) 2. Do you have a personal history of Polyps? no 3. Do you have a family history of Colon Cancer or Polyps? no 4. Diabetes Mellitus? no 5. Joint replacements in the past 12 months?no 6. Major health problems in the past 3 months?no 7. Any artificial heart valves, MVP, or defibrillator?no    MEDICATIONS & ALLERGIES:    Patient reports the following regarding taking any anticoagulation/antiplatelet therapy:   Plavix, Coumadin, Eliquis, Xarelto, Lovenox, Pradaxa, Brilinta, or Effient? no Aspirin? yes (81 mg daily)  Patient confirms/reports the following medications:  Current Outpatient Medications  Medication Sig Dispense Refill  . ascorbic acid (VITAMIN C) 500 MG tablet     . aspirin 81 MG tablet Take 81 mg by mouth daily.      . calcium citrate-vitamin D 200-200 MG-UNIT TABS Take 1 tablet by mouth daily.      . fluticasone (FLONASE) 50 MCG/ACT nasal spray Place 2 sprays into both nostrils daily. (Patient taking differently: Place 2 sprays into both nostrils as needed. ) 16 g 6  . L-Lysine 1000 MG TABS Take by mouth.    . levothyroxine (SYNTHROID) 125 MCG tablet Take 1 tablet (125 mcg total) by mouth daily before breakfast. Only half on Sunday 90 tablet 0  . Multiple Vitamins tablet     . Omega-3 Fatty Acids (RA FISH OIL) 1000 MG CAPS Take by mouth.    . valsartan-hydrochlorothiazide (DIOVAN-HCT) 160-12.5 MG tablet Take 1 tablet by mouth daily. 90 tablet 3  . VIGAMOX 0.5 % ophthalmic solution     . vitamin E 400 UNIT capsule Take 400 Units by mouth daily.      Marland Kitchen ascorbic acid (VITAMIN C) 1000 MG tablet Take by mouth.    . valACYclovir (VALTREX) 1000 MG tablet      No current facility-administered medications for this visit.     Patient confirms/reports the following allergies:  Allergies  Allergen Reactions  . Penicillins Rash    No orders of the defined types were placed in this encounter.   AUTHORIZATION INFORMATION Primary Insurance: 1D#: Group #:  Secondary Insurance: 1D#: Group #:  SCHEDULE INFORMATION: Date: Thursday 01/14/20 Time: Location:[facility]

## 2020-01-05 ENCOUNTER — Other Ambulatory Visit: Payer: Self-pay

## 2020-01-05 ENCOUNTER — Encounter: Payer: Self-pay | Admitting: Gastroenterology

## 2020-01-08 ENCOUNTER — Other Ambulatory Visit: Payer: Self-pay

## 2020-01-08 ENCOUNTER — Encounter: Payer: Self-pay | Admitting: Family Medicine

## 2020-01-08 ENCOUNTER — Ambulatory Visit (INDEPENDENT_AMBULATORY_CARE_PROVIDER_SITE_OTHER): Payer: 59 | Admitting: Family Medicine

## 2020-01-08 VITALS — BP 126/86 | HR 72 | Temp 97.9°F | Resp 16 | Ht 63.5 in | Wt 186.9 lb

## 2020-01-08 DIAGNOSIS — E785 Hyperlipidemia, unspecified: Secondary | ICD-10-CM

## 2020-01-08 DIAGNOSIS — E063 Autoimmune thyroiditis: Secondary | ICD-10-CM | POA: Diagnosis not present

## 2020-01-08 DIAGNOSIS — Z1231 Encounter for screening mammogram for malignant neoplasm of breast: Secondary | ICD-10-CM | POA: Diagnosis not present

## 2020-01-08 DIAGNOSIS — Z23 Encounter for immunization: Secondary | ICD-10-CM | POA: Diagnosis not present

## 2020-01-08 DIAGNOSIS — E669 Obesity, unspecified: Secondary | ICD-10-CM

## 2020-01-08 DIAGNOSIS — Z0000 Encounter for general adult medical examination without abnormal findings: Secondary | ICD-10-CM | POA: Diagnosis not present

## 2020-01-08 DIAGNOSIS — G4733 Obstructive sleep apnea (adult) (pediatric): Secondary | ICD-10-CM | POA: Diagnosis not present

## 2020-01-08 DIAGNOSIS — I10 Essential (primary) hypertension: Secondary | ICD-10-CM | POA: Diagnosis not present

## 2020-01-08 DIAGNOSIS — E038 Other specified hypothyroidism: Secondary | ICD-10-CM

## 2020-01-08 DIAGNOSIS — F325 Major depressive disorder, single episode, in full remission: Secondary | ICD-10-CM

## 2020-01-08 NOTE — Progress Notes (Signed)
Name: Shari Long   MRN: [id number]    DOB: 11-20-1960   Date:01/08/2020       Progress Note  Subjective  Chief Complaint  Chief Complaint  Patient presents with  . Annual Exam    HPI  Patient presents for annual CPE and follow up  Dyslipidemia:low HDL and high LDL, discussed life style modification, she has changed her diet and is trying to lose weight.. She prefers not taking medication for now.She started keto diet a little over year ago . She has lost about 40 lbs ( based on her weigh at home ), we will recheck labs today   The 10-year ASCVD risk score Mikey Bussing DC Brooke Bonito., et al., 2013) is: 5.7%   Values used to calculate the score:     Age: 59 years     Sex: Female     Is Non-Hispanic African American: No     Diabetic: No     Tobacco smoker: No     Systolic Blood Pressure: 259 mmHg     Is BP treated: Yes     HDL Cholesterol: 46 mg/dL     Total Cholesterol: 246 mg/dL  Hypothyroidism: she is taking Synthroid, always feels tired, no change in bowel movement, mild dry skin, she feels cold on a regular basis. Last TSH was at goal   Obesity:.She is now doing great on diet, lost 26 lbs since her last CPE. She is doing well alternating keto for 6 weeks followed by   vegetable/fruits for 2 weeks.   HTN: she is taking medication and denies side effects, no chest pain, palpitation. BP is at goal, no recent episodes of dizziness  Major Depression: she states her symptoms are usuallyworse during the winter, she weaned self off Wellbutrin, she also tried Duloxetine but made her feel dizzy and nauseated, she states symptoms worse during the winter months. Phq 9 at this time is negative   OSA: she was unable to tolerate CPAP machine.She is always tired during the day, explained that CPAP can help with fatigueand also memory and mental fogginess. She is still not ready to try it again   Paresthesia: started in 2020, initially both arms, however progressed to mostly on left arm,  flank and leg. She was seen by Dr. Manuella Ghazi ( neurologist ) had multiple tests done, including MRI C-spine and Brain. Negative for MS. She was advised to follow up with Rheumatologist. She had elevated ANA, she has prickly sensation and fatigue all over her body .   C-spine MRI 08/2019:  Multilevel degenerative changes as detailed above. There is no high-grade canal stenosis. 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   Diet: eating either keto or high fruit and vegetable diet  Exercise: she is riding 5 miles a day on her stationary diet  Also doing some strength training  USPSTF grade A and B recommendations    Office Visit from 04/06/2019 in [facility]  AUDIT-C Score  0     Depression: Phq 9 is  negative Depression screen [facility] 2/9 01/08/2020 04/06/2019 03/26/2019 01/08/2019 04/08/2018  Decreased Interest 0 0 0 1 0  Down, Depressed, Hopeless 0 1 1 1 1   PHQ - 2 Score 0 1 1 2 1   Altered sleeping 1 0 0 1 1  Tired, decreased energy 3 3 3 3 1   Change in appetite 0 0 0 1 1  Feeling bad or failure about yourself  0 0 0 1 1  Trouble concentrating 1 1 1  0 1  Moving slowly or fidgety/restless 0 0 0 0 0  Suicidal thoughts 0 0 0 0 0  PHQ-9 Score 5 5 5 8 6   Difficult doing work/chores Not difficult at all Somewhat difficult Not difficult at all Somewhat difficult Not difficult at all   Hypertension: BP Readings from Last 3 Encounters:  01/08/20 126/86  04/06/19 134/76  01/08/19 (!) 140/98   Obesity: Wt Readings from Last 3 Encounters:  01/08/20 186 lb 14.4 oz (84.8 kg)  04/06/19 201 lb 9.6 oz (91.4 kg)  01/08/19 213 lb 1.6 oz (96.7 kg)   BMI Readings from Last 3 Encounters:  01/08/20 32.59 kg/m  04/06/19 36.29 kg/m  01/08/19 38.36 kg/m     Hep C Screening: up to date  STD testing and prevention (HIV/chl/gon/syphilis): no interested  Intimate partner violence: negative screen  Sexual History (Partners/Practices/Protection from [employer] hx STI/Pregnancy Plans): not  sexually active, husband has ED Pain during Intercourse: N/A Menstrual History/LMP/Abnormal Bleeding:  Discussed post-menopausal bleeding  Incontinence Symptoms: stable, does not want mediations , stress symptoms when bladder full but mild   Breast cancer:  - Last Mammogram: 08/2019  - BRCA gene screening: Grandmother and great- aunts also one great- uncles and paternal uncle  Had  breast cancer an BRCA positive , she was genetic tested and it was  negative   Osteoporosis: Discussed high calcium and vitamin D supplementation, weight bearing exercises  Cervical cancer screening: s/p hysterectomy   Skin cancer: Discussed monitoring for atypical lesions  Colorectal cancer: scheduled for June 2021  Lung cancer:   Low Dose CT Chest recommended if Age 104-80 years, 30 pack-year currently smoking OR have quit w/in 15years. Patient does not qualify.   ECG: 2012   Advanced Care Planning: A voluntary discussion about advance care planning including the explanation and discussion of advance directives.  Discussed health care proxy and Living will, and the patient was able to identify a health care proxy as oldest son . Patient does not have a living will at present time. If patient does have living will, I have requested they bring this to the clinic to be scanned in to their chart.  Lipids: Lab Results  Component Value Date   CHOL 246 (H) 01/08/2019   CHOL 223 (H) 04/08/2018   CHOL 207 (H) 04/01/2017   Lab Results  Component Value Date   HDL 46 (L) 01/08/2019   HDL 45 (L) 04/08/2018   HDL 46 (L) 04/01/2017   Lab Results  Component Value Date   LDLCALC 170 (H) 01/08/2019   LDLCALC 149 (H) 04/08/2018   LDLCALC 138 (H) 04/01/2017   Lab Results  Component Value Date   TRIG 156 (H) 01/08/2019   TRIG 157 (H) 04/08/2018   TRIG 114 04/01/2017   Lab Results  Component Value Date   CHOLHDL 5.3 (H) 01/08/2019   CHOLHDL 5.0 (H) 04/08/2018   CHOLHDL 4.5 04/01/2017   No results found for:  LDLDIRECT  Glucose: Glucose  Date Value Ref Range Status  10/14/2013 83 65 - 99 mg/dL Final   Glucose, Bld  Date Value Ref Range Status  01/08/2019 110 (H) 65 - 99 mg/dL Final    Comment:    .            Fasting reference interval . For someone without known diabetes, a glucose value between 100 and 125 mg/dL is consistent with prediabetes and should be confirmed with a follow-up test. .   04/08/2018 90 65 - 99 mg/dL  Final    Comment:    .            Fasting reference interval .   04/01/2017 89 65 - 99 mg/dL Final    Patient Active Problem List   Diagnosis Date Noted  . Paresthesia 04/06/2019  . Pure hypercholesterolemia 04/06/2019  . OSA (obstructive sleep apnea) 04/01/2017  . Dyslipidemia 10/02/2016  . Morbid obesity ([place]) 10/02/2016  . Hypothyroidism due to Hashimoto's thyroiditis 03/29/2016  . Major depression in remission ([place]) 03/29/2016  . GERD without esophagitis 03/29/2016  . Status post vaginal hysterectomy 11/24/2015  . Vaginal atrophy 11/24/2015  . HTN (hypertension) 12/25/2010    Past Surgical History:  Procedure Laterality Date  . CARDIAC CATHETERIZATION    . FOOT SURGERY  1978   left foot   . TONSILLECTOMY    . VAGINAL HYSTERECTOMY  2002    Family History  Problem Relation Age of Onset  . Cancer - Cervical Mother   . Diabetes Mother   . Thyroid disease Sister   . Breast cancer Paternal Uncle 29  . Colon cancer Paternal Uncle   . Breast cancer Paternal Grandmother 31  . Breast cancer Cousin        ovarian (50's), breast(50's) and uterine(60's) cancer on maternal side.   . Ovarian cancer Cousin   . Heart disease Maternal Grandmother   . Heart disease Paternal Grandfather   . Breast cancer Other        great pat aunts x 2  . Breast cancer Other        great pat uncle    Social History   Socioeconomic History  . Marital status: Married    Spouse name: Richardson Landry  . Number of children: 2  . Years of education: Not on file  . Highest  education level: Master's degree (e.g., MA, MS, MEng, MEd, MSW, MBA)  Occupational History  . 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 Smoking status: Never Smoker  . Smokeless tobacco: Never Used  Substance and Sexual Activity  . Alcohol use: Not Currently    Alcohol/week: 0.0 standard drinks    Comment: rare  . Drug use: No  . Sexual activity: Yes    Partners: Male    Birth control/protection: Surgical  Other Topics Concern  . Not on file  Social History Narrative   She was separated from husband in 2005 but reconciled .    Works as Marine scientist at [employer]   Two grown sons and two grandchildren.    Social Determinants of Health   Financial Resource Strain: Low Risk   . Difficulty of Paying Living Expenses: Not hard at all  Food Insecurity: No Food Insecurity  . Worried About Charity fundraiser in the Last Year: Never true  . Ran Out of Food in the Last Year: Never true  Transportation Needs: No Transportation Needs  . Lack of Transportation (Medical): No  . Lack of Transportation (Non-Medical): No  Physical Activity: Insufficiently Active  . Days of Exercise per Week: 5 days  . Minutes of Exercise per Session: 20 min  Stress: No Stress Concern Present  . Feeling of Stress : Only a little  Social Connections: Not Isolated  . Frequency of Communication with Friends and Family: More than three times a week  . Frequency of Social Gatherings with Friends and Family: Once a week  . Attends Religious Services: 1 to 4 times per  year  . Active Member of Clubs or Organizations: Yes  . Attends Archivist Meetings: Never  . Marital Status: Married  Human resources officer Violence: Not At Risk  . Fear of Current or Ex-Partner: No  . Emotionally Abused: No  . Physically Abused: No  . Sexually Abused: No     Current Outpatient Medications:  .  ascorbic acid (VITAMIN C) 500 MG tablet, , Disp: , Rfl:  .  aspirin 81 MG tablet, Take 81  mg by mouth daily.  , Disp: , Rfl:  .  calcium citrate-vitamin D 200-200 MG-UNIT TABS, Take 1 tablet by mouth daily.  , Disp: , Rfl:  .  fluticasone (FLONASE) 50 MCG/ACT nasal spray, Place 2 sprays into both nostrils daily. (Patient taking differently: Place 2 sprays into both nostrils as needed. ), Disp: 16 g, Rfl: 6 .  L-Lysine 1000 MG TABS, Take by mouth., Disp: , Rfl:  .  levothyroxine (SYNTHROID) 125 MCG tablet, Take 1 tablet (125 mcg total) by mouth daily before breakfast. Only half on Sunday, Disp: 90 tablet, Rfl: 0 .  Multiple Vitamins tablet, , Disp: , Rfl:  .  Omega-3 Fatty Acids (RA FISH OIL) 1000 MG CAPS, Take by mouth., Disp: , Rfl:  .  valsartan-hydrochlorothiazide (DIOVAN-HCT) 160-12.5 MG tablet, Take 1 tablet by mouth daily., Disp: 90 tablet, Rfl: 3 .  vitamin E 400 UNIT capsule, Take 400 Units by mouth daily.  , Disp: , Rfl:   Allergies  Allergen Reactions  . Latex Rash    Gloves when worn over extended period  . Penicillins Rash     ROS  Constitutional: Negative for fever, positive for weight change.  Respiratory: Negative for cough and shortness of breath.   Cardiovascular: Negative for chest pain or palpitations.  Gastrointestinal: Negative for abdominal pain, no bowel changes.  Musculoskeletal: Negative for gait problem or joint swelling.  Skin: Negative for rash.  Neurological: Negative for dizziness or headache. She has intermittent paresthesias - except for her face - always present  No other specific complaints in a complete review of systems (except as listed in HPI above).  Objective  Vitals:   01/08/20 0747  BP: 126/86  Pulse: 72  Resp: 16  Temp: 97.9 F (36.6 C)  TempSrc: Temporal  SpO2: 98%  Weight: 186 lb 14.4 oz (84.8 kg)  Height: 5' 3.5" (1.613 m)    Body mass index is 32.59 kg/m.  Physical Exam  Constitutional: Patient appears well-developed and well-nourished. No distress.  HENT: Head: Normocephalic and atraumatic. Ears: B TMs ok,  no erythema or effusion; Nose: Not done Mouth/Throat: not done Eyes: Conjunctivae and EOM are normal. Pupils are equal, round, and reactive to light. No scleral icterus.  Neck: Normal range of motion. Neck supple. No JVD present. No thyromegaly present.  Cardiovascular: Normal rate, regular rhythm and normal heart sounds.  No murmur heard. No BLE edema. Pulmonary/Chest: Effort normal and breath sounds normal. No respiratory distress. Abdominal: Soft. Bowel sounds are normal, no distension. There is no tenderness. no masses Breast: no lumps or masses, no nipple discharge or rashes FEMALE GENITALIA:  Not done RECTAL: not done  Musculoskeletal: Normal range of motion, no joint effusions. No gross deformities Neurological: he is alert and oriented to person, place, and time. No cranial nerve deficit. Coordination, balance, strength, speech and gait are normal.  Skin: Skin is warm and dry. No rash noted. No erythema.  Psychiatric: Patient has a normal mood and affect. behavior is normal. Judgment and  thought content normal.     Fall Risk: Fall Risk  01/08/2020 04/06/2019 03/26/2019 01/08/2019 04/08/2018  Falls in the past year? 0 0 0 0 No  Number falls in past yr: 0 0 0 0 -  Injury with Fall? 0 0 0 0 -    Functional Status Survey: Is the patient deaf or have difficulty hearing?: No Does the patient have difficulty seeing, even when wearing glasses/contacts?: Yes Does the patient have difficulty concentrating, remembering, or making decisions?: Yes(rememebering and concentrating) Does the patient have difficulty walking or climbing stairs?: No Does the patient have difficulty dressing or bathing?: No Does the patient have difficulty doing errands alone such as visiting a doctor's office or shopping?: No   Assessment & Plan  1. Hypothyroidism due to Hashimoto's thyroiditis  - TSH  2. Essential hypertension  - CBC with Differential/Platelet - COMPLETE METABOLIC PANEL WITH GFR  3. Well  adult exam   4. OSA (obstructive sleep apnea)  Not interested   5. Dyslipidemia  - Lipid panel  6. Major depression in remission  [facility])  Currently doing well   7. Encounter for screening mammogram for malignant neoplasm of breast  Up to date   34. Need for Tdap vaccination  - Tdap vaccine greater than or equal to 7yo IM  9. Need for shingles vaccine  Not given   10. Obesity (BMI 30.0-34.9)  -USPSTF grade A and B recommendations reviewed with patient; age-appropriate recommendations, preventive care, screening tests, etc discussed and encouraged; healthy living encouraged; see AVS for patient education given to patient -Discussed importance of 150 minutes of physical activity weekly, eat two servings of fish weekly, eat one serving of tree nuts ( cashews, pistachios, pecans, almonds.Marland Kitchen) every other day, eat 6 servings of fruit/vegetables daily and drink plenty of water and avoid sweet beverages.   -Reviewed Health Maintenance: **

## 2020-01-08 NOTE — Patient Instructions (Signed)

## 2020-01-09 LAB — COMPLETE METABOLIC PANEL WITH GFR
AG Ratio: 1.7 (calc) (ref 1.0–2.5)
ALT: 18 U/L (ref 6–29)
AST: 29 U/L (ref 10–35)
Albumin: 4.4 g/dL (ref 3.6–5.1)
Alkaline phosphatase (APISO): 73 U/L (ref 37–153)
BUN: 10 mg/dL (ref 7–25)
CO2: 28 mmol/L (ref 20–32)
Calcium: 9.7 mg/dL (ref 8.6–10.4)
Chloride: 102 mmol/L (ref 98–110)
Creat: 0.78 mg/dL (ref 0.50–0.99)
GFR, Est African American: 96 mL/min/{1.73_m2} (ref >=60)
GFR, Est Non African American: 83 mL/min/{1.73_m2} (ref >=60)
Globulin: 2.6 g/dL (calc) (ref 1.9–3.7)
Glucose, Bld: 101 mg/dL — ABNORMAL HIGH (ref 65–99)
Potassium: 4.6 mmol/L (ref 3.5–5.3)
Sodium: 140 mmol/L (ref 135–146)
Total Bilirubin: 0.4 mg/dL (ref 0.2–1.2)
Total Protein: 7 g/dL (ref 6.1–8.1)

## 2020-01-09 LAB — CBC WITH DIFFERENTIAL/PLATELET
Absolute Monocytes: 369 cells/uL (ref 200–950)
Basophils Absolute: 73 cells/uL (ref 0–200)
Basophils Relative: 1.4 %
Eosinophils Absolute: 250 cells/uL (ref 15–500)
Eosinophils Relative: 4.8 %
HCT: 42.7 % (ref 35.0–45.0)
Hemoglobin: 13.7 g/dL (ref 11.7–15.5)
Lymphs Abs: 1768 cells/uL (ref 850–3900)
MCH: 28.5 pg (ref 27.0–33.0)
MCHC: 32.1 g/dL (ref 32.0–36.0)
MCV: 88.8 fL (ref 80.0–100.0)
MPV: 9.2 fL (ref 7.5–12.5)
Monocytes Relative: 7.1 %
Neutro Abs: 2740 cells/uL (ref 1500–7800)
Neutrophils Relative %: 52.7 %
Platelets: 287 10*3/uL (ref 140–400)
RBC: 4.81 10*6/uL (ref 3.80–5.10)
RDW: 13.4 % (ref 11.0–15.0)
Total Lymphocyte: 34 %
WBC: 5.2 10*3/uL (ref 3.8–10.8)

## 2020-01-09 LAB — LIPID PANEL
Cholesterol: 242 mg/dL — ABNORMAL HIGH (ref <200)
HDL: 50 mg/dL (ref >=50)
LDL Cholesterol (Calc): 164 mg/dL (calc) — ABNORMAL HIGH
Non-HDL Cholesterol (Calc): 192 mg/dL (calc) — ABNORMAL HIGH (ref <130)
Total CHOL/HDL Ratio: 4.8 (calc) (ref <5.0)
Triglycerides: 140 mg/dL (ref <150)

## 2020-01-09 LAB — TSH: TSH: 2.13 mIU/L (ref 0.40–4.50)

## 2020-01-12 ENCOUNTER — Other Ambulatory Visit: Payer: Self-pay | Admitting: Family Medicine

## 2020-01-12 DIAGNOSIS — R5383 Other fatigue: Secondary | ICD-10-CM

## 2020-01-12 DIAGNOSIS — R768 Other specified abnormal immunological findings in serum: Secondary | ICD-10-CM

## 2020-01-12 DIAGNOSIS — E038 Other specified hypothyroidism: Secondary | ICD-10-CM

## 2020-01-12 DIAGNOSIS — R202 Paresthesia of skin: Secondary | ICD-10-CM

## 2020-01-12 MED ORDER — LEVOTHYROXINE SODIUM 125 MCG PO TABS: 125.0000 ug | ORAL_TABLET | Freq: Every day | ORAL | 1 refills | Status: DC

## 2020-01-22 ENCOUNTER — Other Ambulatory Visit: Payer: Self-pay

## 2020-01-22 ENCOUNTER — Other Ambulatory Visit
Admission: RE | Admit: 2020-01-22 | Discharge: 2020-01-22 | Disposition: A | Discharge status: Home / Self Care | Payer: 59 | Source: Ambulatory Visit | Attending: Gastroenterology | Admitting: Gastroenterology

## 2020-01-22 DIAGNOSIS — Z20822 Contact with and (suspected) exposure to covid-19: Secondary | ICD-10-CM | POA: Insufficient documentation

## 2020-01-22 DIAGNOSIS — Z01812 Encounter for preprocedural laboratory examination: Secondary | ICD-10-CM | POA: Diagnosis not present

## 2020-01-22 LAB — SARS CORONAVIRUS 2 (TAT 6-24 HRS): SARS Coronavirus 2: NEGATIVE

## 2020-01-25 ENCOUNTER — Other Ambulatory Visit: Payer: Self-pay | Admitting: Family Medicine

## 2020-01-25 DIAGNOSIS — I10 Essential (primary) hypertension: Secondary | ICD-10-CM

## 2020-01-25 NOTE — Telephone Encounter (Signed)
Requested Prescriptions  Pending Prescriptions Disp Refills  . valsartan-hydrochlorothiazide (DIOVAN-HCT) 160-12.5 MG tablet [Pharmacy Med Name: VALSARTAN-HCTZ 160-12.5 MG 160-12.5 Tablet] 90 tablet 1    Sig: TAKE 1 TABLET BY MOUTH DAILY.     Cardiovascular: ARB + Diuretic Combos Passed - 01/25/2020  9:03 AM      Passed - K in normal range and within 180 days    Potassium  Date Value Ref Range Status  01/08/2020 4.6 3.5 - 5.3 mmol/L Final  10/14/2013 3.7 3.5 - 5.1 mmol/L Final         Passed - Na in normal range and within 180 days    Sodium  Date Value Ref Range Status  01/08/2020 140 135 - 146 mmol/L Final  05/18/2015 140 134 - 144 mmol/L Final    Comment:    **Effective May 30, 2015 the reference interval**   for Sodium, Serum will be changing to:                                             136 - 144   10/14/2013 139 136 - 145 mmol/L Final         Passed - Cr in normal range and within 180 days    Creat  Date Value Ref Range Status  01/08/2020 0.78 0.50 - 0.99 mg/dL Final    Comment:    For patients >35 years of age, the reference limit for Creatinine is approximately 13% higher for people identified as African-American. .          Passed - Ca in normal range and within 180 days    Calcium  Date Value Ref Range Status  01/08/2020 9.7 8.6 - 10.4 mg/dL Final   Calcium, Total  Date Value Ref Range Status  10/14/2013 9.5 8.5 - 10.1 mg/dL Final         Passed - Patient is not pregnant      Passed - Last BP in normal range    BP Readings from Last 1 Encounters:  01/08/20 126/86         Passed - Valid encounter within last 6 months    Recent Outpatient Visits          2 weeks ago Hypothyroidism due to Hashimoto's thyroiditis   [facility] Alba Cory, MD   9 months ago Hypothyroidism due to Hashimoto's thyroiditis   [facility] Alba Cory, MD   10 months ago Rash   [facility] Alba Cory, MD   1 year ago Morbid obesity [facility])   [facility] Alba Cory, MD   1 year ago Well woman exam   [facility] Alba Cory, MD      Future Appointments            In 5 months Carlynn Purl, Danna Hefty, MD [facility], [facility]

## 2020-01-26 ENCOUNTER — Encounter: Payer: Self-pay | Admitting: Certified Registered"

## 2020-01-26 ENCOUNTER — Encounter: Payer: Self-pay | Admitting: Gastroenterology

## 2020-01-26 ENCOUNTER — Ambulatory Visit
Admission: RE | Admit: 2020-01-26 | Discharge: 2020-01-26 | Disposition: A | Discharge status: Home / Self Care | Payer: 59 | Attending: Gastroenterology | Admitting: Gastroenterology

## 2020-01-26 ENCOUNTER — Encounter
Admission: RE | Disposition: A | Discharge status: Home / Self Care | Payer: Self-pay | Source: Home / Self Care | Attending: Gastroenterology

## 2020-01-26 ENCOUNTER — Other Ambulatory Visit: Payer: Self-pay

## 2020-01-26 DIAGNOSIS — Z79890 Hormone replacement therapy (postmenopausal): Secondary | ICD-10-CM | POA: Diagnosis not present

## 2020-01-26 DIAGNOSIS — Z880 Allergy status to penicillin: Secondary | ICD-10-CM | POA: Diagnosis not present

## 2020-01-26 DIAGNOSIS — E063 Autoimmune thyroiditis: Secondary | ICD-10-CM | POA: Diagnosis not present

## 2020-01-26 DIAGNOSIS — I10 Essential (primary) hypertension: Secondary | ICD-10-CM | POA: Insufficient documentation

## 2020-01-26 DIAGNOSIS — Z7982 Long term (current) use of aspirin: Secondary | ICD-10-CM | POA: Insufficient documentation

## 2020-01-26 DIAGNOSIS — E785 Hyperlipidemia, unspecified: Secondary | ICD-10-CM | POA: Diagnosis not present

## 2020-01-26 DIAGNOSIS — Z1211 Encounter for screening for malignant neoplasm of colon: Secondary | ICD-10-CM | POA: Insufficient documentation

## 2020-01-26 DIAGNOSIS — E7800 Pure hypercholesterolemia, unspecified: Secondary | ICD-10-CM | POA: Diagnosis not present

## 2020-01-26 DIAGNOSIS — K640 First degree hemorrhoids: Secondary | ICD-10-CM | POA: Insufficient documentation

## 2020-01-26 DIAGNOSIS — K5730 Diverticulosis of large intestine without perforation or abscess without bleeding: Secondary | ICD-10-CM | POA: Diagnosis not present

## 2020-01-26 DIAGNOSIS — E039 Hypothyroidism, unspecified: Secondary | ICD-10-CM | POA: Diagnosis not present

## 2020-01-26 DIAGNOSIS — Z79899 Other long term (current) drug therapy: Secondary | ICD-10-CM | POA: Insufficient documentation

## 2020-01-26 DIAGNOSIS — G4733 Obstructive sleep apnea (adult) (pediatric): Secondary | ICD-10-CM | POA: Diagnosis not present

## 2020-01-26 DIAGNOSIS — G4730 Sleep apnea, unspecified: Secondary | ICD-10-CM | POA: Insufficient documentation

## 2020-01-26 HISTORY — DX: Family history of other specified conditions: Z84.89

## 2020-01-26 HISTORY — PX: COLONOSCOPY WITH PROPOFOL: SHX5780

## 2020-01-26 HISTORY — DX: Anesthesia of skin: R20.0

## 2020-01-26 SURGERY — COLONOSCOPY WITH PROPOFOL
Anesthesia: General

## 2020-01-26 MED ORDER — LIDOCAINE HCL (CARDIAC) PF 100 MG/5ML IV SOSY
PREFILLED_SYRINGE | INTRAVENOUS | Status: DC | PRN
  Administered 2020-01-26: 100 mg via INTRAVENOUS

## 2020-01-26 MED ORDER — PROPOFOL 10 MG/ML IV BOLUS
INTRAVENOUS | Status: DC | PRN
  Administered 2020-01-26: 10 mg via INTRAVENOUS
  Administered 2020-01-26: 40 mg via INTRAVENOUS

## 2020-01-26 MED ORDER — GLYCOPYRROLATE 0.2 MG/ML IJ SOLN
INTRAMUSCULAR | Status: DC | PRN
  Administered 2020-01-26: .2 mg via INTRAVENOUS

## 2020-01-26 MED ORDER — SODIUM CHLORIDE 0.9 % IV SOLN: INTRAVENOUS | Status: DC

## 2020-01-26 MED ORDER — PROPOFOL 500 MG/50ML IV EMUL
INTRAVENOUS | Status: DC | PRN
  Administered 2020-01-26: 155 ug/kg/min via INTRAVENOUS

## 2020-01-26 NOTE — Transfer of Care (Signed)
Immediate Anesthesia Transfer of Care Note  Patient: Shari Long  Procedure(s) Performed: COLONOSCOPY WITH PROPOFOL (N/A )  Patient Location: Endoscopy Unit  Anesthesia Type:General  Level of Consciousness: awake, alert , oriented and patient cooperative  Airway & Oxygen Therapy: Patient Spontanous Breathing and Patient connected to face mask oxygen  Post-op Assessment: Report given to RN and Post -op Vital signs reviewed and stable  Post vital signs: Reviewed and stable  Last Vitals:  Vitals Value Taken Time  BP 120/76 01/26/20 0941  Temp    Pulse 72 01/26/20 0941  Resp 16 01/26/20 0941  SpO2 100 % 01/26/20 0941  Vitals shown include unvalidated device data.  Last Pain:  Vitals:   01/26/20 0820  TempSrc: Temporal  PainSc: 0-No pain         Complications: No complications documented.

## 2020-01-26 NOTE — H&P (Signed)
Shari Lame, MD [address] [place], [place] [address] Phone: [phone] Fax : [phone]  Primary Care Physician:  Steele Sizer, MD Primary Gastroenterologist:  Dr. Allen Norris  Pre-Procedure History & Physical: HPI:  Shari Long is a 59 y.o. female is here for a screening colonoscopy.   Past Medical History:  Diagnosis Date  . DDD (degenerative disc disease), lumbar   . Degenerative disc disease   . Family history of adverse reaction to anesthesia    Father - Malignant hyperthermia  . Fatigue   . Hypertension   . Increased BMI   . Menopause   . Neurotrophic keratitis of left eye   . Numbness of foot    left  . Seasonal allergies   . Thyroid disease    hypothyroidism  . Vaginal atrophy     Past Surgical History:  Procedure Laterality Date  . CARDIAC CATHETERIZATION    . FOOT SURGERY  1978   left foot   . TONSILLECTOMY    . VAGINAL HYSTERECTOMY  2002    Prior to Admission medications   Medication Sig Start Date End Date Taking? Authorizing Provider  ascorbic acid (VITAMIN C) 500 MG tablet  08/21/19  Yes [provider]  aspirin 81 MG tablet Take 81 mg by mouth daily.     Yes [provider]  calcium citrate-vitamin D 200-200 MG-UNIT TABS Take 1 tablet by mouth daily.     Yes [provider]  fluticasone (FLONASE) 50 MCG/ACT nasal spray Place 2 sprays into both nostrils daily. Patient taking differently: Place 2 sprays into both nostrils as needed.  03/06/16  Yes Fisher, Linden Dolin, PA-C  L-Lysine 1000 MG TABS Take by mouth.   Yes [provider]  levothyroxine (SYNTHROID) 125 MCG tablet Take 1 tablet (125 mcg total) by mouth daily before breakfast. Only half on Sunday 01/12/20  Yes Steele Sizer, MD  Multiple Vitamins tablet  08/21/19  Yes [provider]  Omega-3 Fatty Acids (RA FISH OIL) 1000 MG CAPS Take by mouth.   Yes [provider]  valsartan-hydrochlorothiazide (DIOVAN-HCT) 160-12.5 MG  tablet TAKE 1 TABLET BY MOUTH DAILY. 01/25/20  Yes Sowles, Drue Stager, MD  vitamin E 400 UNIT capsule Take 400 Units by mouth daily.     Yes [provider]    Allergies as of 12/15/2019 - Review Complete 04/06/2019  Allergen Reaction Noted  . Penicillins Rash 11/21/2015    Family History  Problem Relation Age of Onset  . Cancer - Cervical Mother   . Diabetes Mother   . Thyroid disease Sister   . Breast cancer Paternal Uncle 54  . Colon cancer Paternal Uncle   . Breast cancer Paternal Grandmother 39  . Breast cancer Cousin        ovarian (50's), breast(50's) and uterine(60's) cancer on maternal side.   . Ovarian cancer Cousin   . Heart disease Maternal Grandmother   . Heart disease Paternal Grandfather   . Breast cancer Other        great pat aunts x 2  . Breast cancer Other        great pat uncle    Social History   Socioeconomic History  . Marital status: Married    Spouse name: Richardson Landry  . Number of children: 2  . Years of education: Not on file  . Highest education level: Master's degree (e.g., MA, MS, MEng, MEd, MSW, MBA)  Occupational History  . 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 Smoking status: Never Smoker  . Smokeless tobacco: Never Used  Vaping Use  . Vaping Use: Never used  Substance and Sexual Activity  . Alcohol use: Not Currently    Alcohol/week: 0.0 standard drinks    Comment: rare  . Drug use: No  . Sexual activity: Yes    Partners: Male    Birth control/protection: Surgical  Other Topics Concern  . Not on file  Social History Narrative   She was separated from husband in 2005 but reconciled .    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    Social Determinants of Health   Financial Resource Strain: Low Risk   . Difficulty of Paying Living Expenses: Not hard at all  Food Insecurity: No Food Insecurity  . Worried About Programme researcher, broadcasting/film/video in the Last Year: Never  true  . Ran Out of Food in the Last Year: Never true  Transportation Needs: No Transportation Needs  . Lack of Transportation (Medical): No  . Lack of Transportation (Non-Medical): No  Physical Activity: Insufficiently Active  . Days of Exercise per Week: 5 days  . Minutes of Exercise per Session: 20 min  Stress: No Stress Concern Present  . Feeling of Stress : Only a little  Social Connections: Socially Integrated  . Frequency of Communication with Friends and Family: More than three times a week  . Frequency of Social Gatherings with Friends and Family: Once a week  . Attends Religious Services: 1 to 4 times per year  . Active Member of Clubs or Organizations: Yes  . Attends Banker Meetings: Never  . Marital Status: Married  Catering manager Violence: Not At Risk  . Fear of Current or Ex-Partner: No  . Emotionally Abused: No  . Physically Abused: No  . Sexually Abused: No    Review of Systems: See HPI, otherwise negative ROS  Physical Exam: BP 113/83   Pulse 79   Temp (!) 96.7 F (35.9 C) (Temporal)   Resp 17   Ht 5\' 3"  (1.6 m)   Wt 81.6 kg   SpO2 94%   BMI 31.89 kg/m  General:   Alert,  pleasant and cooperative in NAD Head:  Normocephalic and atraumatic. Neck:  Supple; no masses or thyromegaly. Lungs:  Clear throughout to auscultation.    Heart:  Regular rate and rhythm. Abdomen:  Soft, nontender and nondistended. Normal bowel sounds, without guarding, and without rebound.   Neurologic:  Alert and  oriented x4;  grossly normal neurologically.  Impression/Plan: Shari Long is now here to undergo a screening colonoscopy.  Risks, benefits, and alternatives regarding colonoscopy have been reviewed with the patient.  Questions have been answered.  All parties agreeable.

## 2020-01-26 NOTE — Op Note (Signed)
[facility] Gastroenterology Patient Name: Shari Long Procedure Date: 01/26/2020 9:19 AM MRN: [id number] Account #: [id number] Date of Birth: Aug 23, 1960 Admit Type: Outpatient Age: 59 Room: [facility] ENDO ROOM 4 Gender: Female Note Status: Finalized Procedure:             Colonoscopy Indications:           Screening for colorectal malignant neoplasm Providers:             Midge Minium MD, MD Referring MD:          Onnie Boer. Sowles, MD (Referring MD) Medicines:             Propofol per Anesthesia Complications:         No immediate complications. Procedure:             Pre-Anesthesia Assessment:                        - Prior to the procedure, a History and Physical was                         performed, and patient medications and allergies were                         reviewed. The patient's tolerance of previous                         anesthesia was also reviewed. The risks and benefits                         of the procedure and the sedation options and risks                         were discussed with the patient. All questions were                         answered, and informed consent was obtained. Prior                         Anticoagulants: The patient has taken no previous                         anticoagulant or antiplatelet agents. ASA Grade                         Assessment: II - A patient with mild systemic disease.                         After reviewing the risks and benefits, the patient                         was deemed in satisfactory condition to undergo the                         procedure.                        After obtaining informed consent, the colonoscope was  passed under direct vision. Throughout the procedure,                         the patient's blood pressure, pulse, and oxygen                         saturations were monitored continuously. The                         Colonoscope was introduced through  the anus and                         advanced to the the cecum, identified by appendiceal                         orifice and ileocecal valve. The colonoscopy was                         performed without difficulty. The patient tolerated                         the procedure well. The quality of the bowel                         preparation was excellent. Findings:      The perianal and digital rectal examinations were normal.      Multiple small-mouthed diverticula were found in the sigmoid colon and       descending colon.      Non-bleeding internal hemorrhoids were found during retroflexion. The       hemorrhoids were Grade I (internal hemorrhoids that do not prolapse). Impression:            - Diverticulosis in the sigmoid colon and in the                         descending colon.                        - Non-bleeding internal hemorrhoids.                        - No specimens collected. Recommendation:        - Discharge patient to home.                        - Resume previous diet.                        - Continue present medications.                        - Repeat colonoscopy in 10 years for screening unless                         any change in family history or lower GI problems. Procedure Code(s):     --- Professional ---                        [phone], Colonoscopy, flexible; diagnostic, including  collection of specimen(s) by brushing or washing, when                         performed (separate procedure) CPT copyright 2019 [employer]. All rights reserved. The codes documented in this report are preliminary and upon coder review may  be revised to meet current compliance requirements. Lucilla Lame MD, MD 01/26/2020 9:37:57 AM This report has been signed electronically. Number of Addenda: 0 Note Initiated On: 01/26/2020 9:19 AM Scope Withdrawal Time: 0 hours 6 minutes 36 seconds  Total Procedure Duration: 0 hours 11 minutes 54  seconds  Estimated Blood Loss:  Estimated blood loss: none.      [facility]

## 2020-01-26 NOTE — Anesthesia Postprocedure Evaluation (Signed)
Anesthesia Post Note  Patient: Shari Long  Procedure(s) Performed: COLONOSCOPY WITH PROPOFOL (N/A )  Patient location during evaluation: Endoscopy Anesthesia Type: General Level of consciousness: awake and alert Pain management: pain level controlled Vital Signs Assessment: post-procedure vital signs reviewed and stable Respiratory status: spontaneous breathing and respiratory function stable Cardiovascular status: stable Anesthetic complications: no   No complications documented.   Last Vitals:  Vitals:   01/26/20 0941 01/26/20 0950  BP:  128/76  Pulse:    Resp:    Temp: (!) 35.7 C   SpO2:      Last Pain:  Vitals:   01/26/20 1010  TempSrc:   PainSc: 0-No pain                 Yelena Metzer K

## 2020-01-26 NOTE — Anesthesia Preprocedure Evaluation (Signed)
Anesthesia Evaluation  Patient identified by MRN, date of birth, ID band Patient awake    Reviewed: Allergy & Precautions, NPO status , Patient's Chart, lab work & pertinent test results  History of Anesthesia Complications Negative for: history of anesthetic complications  Airway Mallampati: II       Dental   Pulmonary sleep apnea (not able to tolerate CPAP, OK with sleeping on side) , neg COPD, Not current smoker,           Cardiovascular hypertension, Pt. on medications (-) Past MI and (-) CHF (-) dysrhythmias (-) Valvular Problems/Murmurs     Neuro/Psych neg Seizures Depression    GI/Hepatic Neg liver ROS, GERD  Medicated,  Endo/Other  neg diabetesHypothyroidism (Hashimoto's)   Renal/GU negative Renal ROS     Musculoskeletal   Abdominal   Peds  Hematology   Anesthesia Other Findings   Reproductive/Obstetrics                             Anesthesia Physical Anesthesia Plan  ASA: III  Anesthesia Plan: General   Post-op Pain Management:    Induction: Intravenous  PONV Risk Score and Plan: 3 and Propofol infusion, TIVA and Treatment may vary due to age or medical condition  Airway Management Planned: Nasal Cannula  Additional Equipment:   Intra-op Plan:   Post-operative Plan:   Informed Consent: I have reviewed the patients History and Physical, chart, labs and discussed the procedure including the risks, benefits and alternatives for the proposed anesthesia with the patient or authorized representative who has indicated his/her understanding and acceptance.       Plan Discussed with:   Anesthesia Plan Comments:         Anesthesia Quick Evaluation

## 2020-01-27 ENCOUNTER — Encounter: Payer: Self-pay | Admitting: Gastroenterology

## 2020-02-04 DIAGNOSIS — M79609 Pain in unspecified limb: Secondary | ICD-10-CM | POA: Insufficient documentation

## 2020-02-04 DIAGNOSIS — R202 Paresthesia of skin: Secondary | ICD-10-CM | POA: Diagnosis not present

## 2020-02-04 DIAGNOSIS — R768 Other specified abnormal immunological findings in serum: Secondary | ICD-10-CM | POA: Diagnosis not present

## 2020-03-02 ENCOUNTER — Encounter: Payer: Self-pay | Admitting: Family Medicine

## 2020-03-02 DIAGNOSIS — H16232 Neurotrophic keratoconjunctivitis, left eye: Secondary | ICD-10-CM | POA: Diagnosis not present

## 2020-03-14 ENCOUNTER — Encounter: Payer: Self-pay | Admitting: Family Medicine

## 2020-03-15 ENCOUNTER — Other Ambulatory Visit: Payer: Self-pay | Admitting: Family Medicine

## 2020-03-15 DIAGNOSIS — R202 Paresthesia of skin: Secondary | ICD-10-CM

## 2020-03-17 DIAGNOSIS — R202 Paresthesia of skin: Secondary | ICD-10-CM | POA: Diagnosis not present

## 2020-03-18 LAB — SARS-COV-2 ANTIBODY(IGG)SPIKE,SEMI-QUANTITATIVE: SARS COV1 AB(IGG)SPIKE,SEMI QN: <1 index (ref <1.00)

## 2020-07-11 NOTE — Progress Notes (Signed)
Name: Shari Long   MRN: [id number]    DOB: Aug 05, 1961   Date:07/12/2020       Progress Note  Subjective  Chief Complaint  Chief Complaint  Patient presents with  . Follow-up    HPI   Dyslipidemia:mild improvement of labs on her last visit May 2021. She retired, no longer doing [employer]. She has not been physically active lately   The 10-year ASCVD risk score Mikey Bussing DC Brooke Bonito., et al., 2013) is: 6.9%   Values used to calculate the score:     Age: 59 years     Sex: Female     Is Non-Hispanic African American: No     Diabetic: No     Tobacco smoker: No     Systolic Blood Pressure: 856 mmHg     Is BP treated: Yes     HDL Cholesterol: 50 mg/dL     Total Cholesterol: 242 mg/dL  Hypothyroidism: she is taking Synthroid,  no change in bowel movement, mild dry skin, she feels cold on a regular basis. Last TSH was at goal . She has gained weight but states not related to thyroid, she retired two months ago and has not been as active lately   Obesity:.She was doing great on keto diet ,she had  lost 26 lbs prior to last visit, but gained it back since. Not as active since retirement   HTN: she is taking medication and denies side effects, no chest pain, palpitation. BP slightly higher today but still at goal.   Major Depression: she states her symptoms are usuallyworse during the winter, she weaned self off Wellbutrin, she also tried Duloxetine but made her feel dizzy and nauseated, she states symptoms worse during the winter months. Phq 9 still normal. She states she was having problems sleeping but doing better since retirement, occasionally wakes up and has difficulty falling back asleep now    OSA: she was unable to tolerate CPAP machine.She is always tired during the day, explained that CPAP can help with fatigueand also memory and mental fogginess. Unchanged, does not want to resume it   Paresthesia: started in 2020, initially both arms, however progressed to mostly on  left arm, flank and leg. She was seen by Dr. Manuella Ghazi ( neurologist ) had multiple tests done, including MRI C-spine and Brain. Negative for MS. She was advised to follow up with Rheumatologist. She had elevated ANA, she has prickly sensation and fatigue all over her body . Unchnaged  C-spine MRI 08/2019:  Multilevel degenerative changes as detailed above. There is no high-grade canal stenosis. 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 She states son checked bp and it was low, she felt tired and weak the rest of the day, nausea resolved after she vomited. She did not go to [facility] [place] , since that day she felt fine. No problems since. She skipped bp medication for one day only and has been on it again since.    Patient Active Problem List   Diagnosis Date Noted  . 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 Paresthesia and pain of left extremity 02/04/2020  . Paresthesia 04/06/2019  . Pure hypercholesterolemia 04/06/2019  . OSA (obstructive sleep apnea) 04/01/2017  . Dyslipidemia 10/02/2016  . Hypothyroidism due  to Hashimoto's thyroiditis 03/29/2016  . Major depression in remission ([place]) 03/29/2016  . GERD without esophagitis 03/29/2016  . Status post vaginal hysterectomy 11/24/2015  . Vaginal atrophy 11/24/2015  . HTN (hypertension) 12/25/2010    Past Surgical History:  Procedure Laterality Date  . CARDIAC CATHETERIZATION    . COLONOSCOPY WITH PROPOFOL N/A 01/26/2020   Procedure: COLONOSCOPY WITH PROPOFOL;  Surgeon: Lucilla Lame, MD;  Location: [facility] ENDOSCOPY;  Service: Endoscopy;  Laterality: N/A;  . FOOT SURGERY  1978   left foot   . TONSILLECTOMY    . VAGINAL HYSTERECTOMY  2002    Family History  Problem Relation Age of Onset  . Cancer - Cervical Mother    . Diabetes Mother   . Thyroid disease Sister   . Breast cancer Paternal Uncle 54  . Colon cancer Paternal Uncle   . Breast cancer Paternal Grandmother 51  . Breast cancer Cousin        ovarian (50's), breast(50's) and uterine(60's) cancer on maternal side.   . Ovarian cancer Cousin   . Heart disease Maternal Grandmother   . Heart disease Paternal Grandfather   . Breast cancer Other        great pat aunts x 2  . Breast cancer Other        great pat uncle    Social History   Tobacco Use  . Smoking status: Never Smoker  . Smokeless tobacco: Never Used  Substance Use Topics  . Alcohol use: Not Currently    Alcohol/week: 0.0 standard drinks    Comment: rare     Current Outpatient Medications:  .  ascorbic acid (VITAMIN C) 500 MG tablet, , Disp: , Rfl:  .  aspirin 81 MG tablet, Take 81 mg by mouth daily.  , Disp: , Rfl:  .  calcium citrate-vitamin D 200-200 MG-UNIT TABS, Take 1 tablet by mouth daily.  , Disp: , Rfl:  .  L-Lysine 1000 MG TABS, Take by mouth., Disp: , Rfl:  .  levothyroxine (SYNTHROID) 125 MCG tablet, Take 1 tablet (125 mcg total) by mouth daily before breakfast. Only half on Sunday, Disp: 90 tablet, Rfl: 1 .  Multiple Vitamins tablet, , Disp: , Rfl:  .  Omega-3 Fatty Acids (RA FISH OIL) 1000 MG CAPS, Take by mouth., Disp: , Rfl:  .  SUPREP BOWEL PREP KIT 17.5-3.13-1.6 GM/177ML SOLN, SMARTSIG:354 Milliliter(s) By Mouth As Directed, Disp: , Rfl:  .  valsartan-hydrochlorothiazide (DIOVAN-HCT) 160-12.5 MG tablet, TAKE 1 TABLET BY MOUTH DAILY., Disp: 90 tablet, Rfl: 1 .  vitamin E 400 UNIT capsule, Take 400 Units by mouth daily.  , Disp: , Rfl:   Allergies  Allergen Reactions  . Latex Rash    Gloves when worn over extended period  . Penicillins Rash    I personally reviewed active problem list, medication list, allergies, family history, social history, health maintenance with the patient/caregiver today.   ROS  Constitutional: Negative for fever, positive  for weight change.  Respiratory: Negative for cough and shortness of breath.   Cardiovascular: Negative for chest pain or palpitations.  Gastrointestinal: Negative for abdominal pain, no bowel changes.  Musculoskeletal: Negative for gait problem or joint swelling.  Skin: Negative for rash.  Neurological: Negative for dizziness or headache.  No other specific complaints in a complete review of systems (except as listed in HPI above).  Objective  Vitals:   07/12/20 0738  BP: 138/76  Pulse: 89  Resp: 16  Temp: 98 F (36.7 C)  TempSrc:  Oral  SpO2: 99%  Weight: 201 lb 1.6 oz (91.2 kg)  Height: _0  (1.626 m)    Body mass index is 34.52 kg/m.  Physical Exam  Constitutional: Patient appears well-developed and well-nourished. Obese  No distress.  HEENT: head atraumatic, normocephalic, pupils equal and reactive to light,  neck supple Cardiovascular: Normal rate, regular rhythm and normal heart sounds.  No murmur heard. No BLE edema. Pulmonary/Chest: Effort normal and breath sounds normal. No respiratory distress. Abdominal: Soft.  There is no tenderness. Psychiatric: Patient has a normal mood and affect. behavior is normal. Judgment and thought content normal.  PHQ2/9: Depression screen [facility] 2/9 07/12/2020 01/08/2020 04/06/2019 03/26/2019 01/08/2019  Decreased Interest 0 0 0 0 1  Down, Depressed, Hopeless 0 0 _1 PHQ - 2 Score 0 0 _2 Altered sleeping 1 1 0 0 1  Tired, decreased energy _3 Change in appetite 1 0 0 0 1  Feeling bad or failure about yourself  1 0 0 0 1  Trouble concentrating 0 _4 0  Moving slowly or fidgety/restless 0 0 0 0 0  Suicidal thoughts 0 0 0 0 0  PHQ-9 Score _5 Difficult doing work/chores Not difficult at all Not difficult at all Somewhat difficult Not difficult at all Somewhat difficult    phq 9 is negative   Fall Risk: Fall Risk  07/12/2020 01/08/2020 04/06/2019 03/26/2019 01/08/2019  Falls in the past year? 1 0 0 0 0  Number  falls in past yr: 0 0 0 0 0  Injury with Fall? 1 0 0 0 0     Functional Status Survey: Is the patient deaf or have difficulty hearing?: No Does the patient have difficulty seeing, even when wearing glasses/contacts?: Yes Does the patient have difficulty concentrating, remembering, or making decisions?: No Does the patient have difficulty walking or climbing stairs?: No Does the patient have difficulty dressing or bathing?: No Does the patient have difficulty doing errands alone such as visiting a doctor's office or shopping?: No    Assessment & Plan  1. Hypothyroidism due to Hashimoto's thyroiditis  - levothyroxine (SYNTHROID) 125 MCG tablet; Take 1 tablet (125 mcg total) by mouth daily before breakfast. Only half on Sunday  Dispense: 90 tablet; Refill: 1  2. Essential hypertension  - valsartan-hydrochlorothiazide (DIOVAN-HCT) 160-12.5 MG tablet; Take 1 tablet by mouth daily.  Dispense: 90 tablet; Refill: 1  3. Dyslipidemia  She will resume life style modification   4. Elevated antinuclear antibody (ANA) level   5. Major depression in remission ([place])   6. Obesity (BMI 30.0-34.9)  She is trying a different diet this week, going to follow a balance diet now, avoid red meat, more chicken and fish, more vegetables.   7. OSA (obstructive sleep apnea)  Refuses CPAP   8. Syncope, unspecified syncope type  Advised to call 911 if it happens again, explained syncope can be a multitude of things and since asymptomatic we will hold off on other evaluation at this time

## 2020-07-12 ENCOUNTER — Encounter: Payer: Self-pay | Admitting: Family Medicine

## 2020-07-12 ENCOUNTER — Ambulatory Visit (INDEPENDENT_AMBULATORY_CARE_PROVIDER_SITE_OTHER): Payer: 59 | Admitting: Family Medicine

## 2020-07-12 ENCOUNTER — Other Ambulatory Visit: Payer: Self-pay | Admitting: Family Medicine

## 2020-07-12 ENCOUNTER — Other Ambulatory Visit: Payer: Self-pay

## 2020-07-12 VITALS — BP 138/76 | HR 89 | Temp 98.0°F | Resp 16 | Ht 64.0 in | Wt 201.1 lb

## 2020-07-12 DIAGNOSIS — I10 Essential (primary) hypertension: Secondary | ICD-10-CM | POA: Diagnosis not present

## 2020-07-12 DIAGNOSIS — E063 Autoimmune thyroiditis: Secondary | ICD-10-CM

## 2020-07-12 DIAGNOSIS — E669 Obesity, unspecified: Secondary | ICD-10-CM | POA: Diagnosis not present

## 2020-07-12 DIAGNOSIS — E038 Other specified hypothyroidism: Secondary | ICD-10-CM

## 2020-07-12 DIAGNOSIS — R768 Other specified abnormal immunological findings in serum: Secondary | ICD-10-CM | POA: Diagnosis not present

## 2020-07-12 DIAGNOSIS — F325 Major depressive disorder, single episode, in full remission: Secondary | ICD-10-CM | POA: Diagnosis not present

## 2020-07-12 DIAGNOSIS — G4733 Obstructive sleep apnea (adult) (pediatric): Secondary | ICD-10-CM

## 2020-07-12 DIAGNOSIS — E66811 Obesity, class 1: Secondary | ICD-10-CM

## 2020-07-12 DIAGNOSIS — E7800 Pure hypercholesterolemia, unspecified: Secondary | ICD-10-CM

## 2020-07-12 DIAGNOSIS — R55 Syncope and collapse: Secondary | ICD-10-CM | POA: Diagnosis not present

## 2020-07-12 DIAGNOSIS — E785 Hyperlipidemia, unspecified: Secondary | ICD-10-CM

## 2020-07-12 MED ORDER — LEVOTHYROXINE SODIUM 125 MCG PO TABS: 125.0000 ug | ORAL_TABLET | Freq: Every day | ORAL | 1 refills | Status: DC

## 2020-07-12 MED ORDER — VALSARTAN-HYDROCHLOROTHIAZIDE 160-12.5 MG PO TABS: 1.0000 | ORAL_TABLET | Freq: Every day | ORAL | 1 refills | Status: DC

## 2020-08-02 ENCOUNTER — Other Ambulatory Visit: Payer: Self-pay

## 2021-01-05 NOTE — Progress Notes (Signed)
Name: Shari Long   MRN: [id number]    DOB: October 21, 1960   Date:01/10/2021       Progress Note  Subjective  Chief Complaint  Follow Up  HPI  Dyslipidemia:mild improvement of labs on her last visit May 2021. She retired, she is following a healthy diet . She has been ery active in her yard, maintaining her pool, helping with her grandchildren   The 10-year ASCVD risk score Denman George DC Jr., et al., 2013) is: 6.3%   Values used to calculate the score:     Age: 60 years     Sex: Female     Is Non-Hispanic African American: No     Diabetic: No     Tobacco smoker: No     Systolic Blood Pressure: 132 mmHg     Is BP treated: Yes     HDL Cholesterol: 50 mg/dL     Total Cholesterol: 242 mg/dL  Hypothyroidism: she is taking Synthroid,  no change in bowel movement, mild dry skin, she feels cold on a regular basis - but that is her baseline . Last TSH was at goal but is due for lab recheck .   Obesity:.She was doing great on keto diet ,she had  lost 26 lbs prior to last visit, but gained it back since. Not as active since retirement   HTN: she is taking medication and denies side effects, no chest pain, palpitation. BP slightly higher today but still at goal.   Major Depression: she states her symptoms are usuallyworse during the winter, she weaned self off Wellbutrin, she also tried Duloxetine but made her feel dizzy and nauseated, she retired and seems to be feeling better, does not want to resume medication at this time   OSA: she was unable to tolerate CPAP machine.She is always tired during the day, explained that CPAP can help with fatigueand also memory and mental fogginess. She is not interested in trying again   Paresthesia: started in 2020, initially both arms, however progressed to mostly on left arm, flank and leg. She was seen by Dr. Sherryll Burger ( neurologist ) had multiple tests done, including MRI C-spine and Brain. Negative for MS. .She states symptoms not as severe lately,  intermittent paresthesia only . She saw Rheumatologist but was given reassurance   C-spine MRI 08/2019:  Multilevel degenerative changes as detailed above. There is no high-grade canal stenosis. 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  Syncopal episode: it happened Fall 2021  she noticed a wave of nausea followed by syncopal episode, she had some palpitation and diaphoresis , woke up on the floor, hit her face on something, she vomited, was able to get up and clean the floor. She states son checked bp and it was low, she felt tired and weak the rest of the day, nausea resolved after she vomited. She did not go to [facility] , she denies any recurrence since    Patient Active Problem List   Diagnosis Date Noted  . 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 Paresthesia and pain of left extremity 02/04/2020  . Paresthesia 04/06/2019  . Pure hypercholesterolemia 04/06/2019  . OSA (obstructive sleep apnea) 04/01/2017  . Dyslipidemia 10/02/2016  . Hypothyroidism due to Hashimoto's thyroiditis 03/29/2016  . Major depression in remission (HCC) 03/29/2016  . GERD without esophagitis 03/29/2016  . Status post vaginal hysterectomy 11/24/2015  . Vaginal atrophy 11/24/2015  . HTN (hypertension) 12/25/2010    Past Surgical History:  Procedure Laterality Date  . CARDIAC CATHETERIZATION    .  COLONOSCOPY WITH PROPOFOL N/A 01/26/2020   Procedure: COLONOSCOPY WITH PROPOFOL;  Surgeon: Midge Minium, MD;  Location: [facility] ENDOSCOPY;  Service: Endoscopy;  Laterality: N/A;  . FOOT SURGERY  1978   left foot   . TONSILLECTOMY    . VAGINAL HYSTERECTOMY  2002    Family History  Problem Relation Age of Onset  . Cancer - Cervical Mother   . Diabetes Mother   . Thyroid disease Sister   . Breast cancer Paternal Uncle 61  . Colon cancer Paternal Uncle   . Breast cancer Paternal Grandmother 34  . Breast cancer Cousin        ovarian (50's), breast(50's) and uterine(60's) cancer on maternal side.   . Ovarian cancer  Cousin   . Heart disease Maternal Grandmother   . Heart disease Paternal Grandfather   . Breast cancer Other        great pat aunts x 2  . Breast cancer Other        great pat uncle    Social History   Tobacco Use  . Smoking status: Never Smoker  . Smokeless tobacco: Never Used  Substance Use Topics  . Alcohol use: Not Currently    Alcohol/week: 0.0 standard drinks    Comment: rare     Current Outpatient Medications:  .  ascorbic acid (VITAMIN C) 500 MG tablet, , Disp: , Rfl:  .  aspirin 81 MG tablet, Take 81 mg by mouth daily., Disp: , Rfl:  .  calcium citrate-vitamin D 200-200 MG-UNIT TABS, Take 1 tablet by mouth daily., Disp: , Rfl:  .  L-Lysine 1000 MG TABS, Take by mouth 2 (two) times daily., Disp: , Rfl:  .  levothyroxine (SYNTHROID) 125 MCG tablet, TAKE 1 TABLET BY MOUTH DAILY BEFORE BREAKFAST. ONLY 1/2 TABLET ON SUNDAY, Disp: 90 tablet, Rfl: 1 .  Multiple Vitamins tablet, , Disp: , Rfl:  .  Omega-3 Fatty Acids (RA FISH OIL) 1000 MG CAPS, Take by mouth., Disp: , Rfl:  .  valsartan-hydrochlorothiazide (DIOVAN-HCT) 160-12.5 MG tablet, TAKE 1 TABLET BY MOUTH DAILY., Disp: 90 tablet, Rfl: 1 .  vitamin E 400 UNIT capsule, Take 400 Units by mouth daily., Disp: , Rfl:   Allergies  Allergen Reactions  . Latex Rash    Gloves when worn over extended period  . Penicillins Rash    I personally reviewed active problem list, medication list, allergies, family history, social history, health maintenance with the patient/caregiver today.   ROS  Constitutional: Negative for fever, positive for  weight change.  Respiratory: Negative for cough and shortness of breath.   Cardiovascular: Negative for chest pain or palpitations.  Gastrointestinal: Negative for abdominal pain, no bowel changes.  Musculoskeletal: Negative for gait problem or joint swelling.  Skin: Negative for rash.  Neurological: Negative for dizziness or headache.  No other specific complaints in a complete review  of systems (except as listed in HPI above).  Objective  Vitals:   01/10/21 0920  BP: 132/78  Pulse: 75  Resp: 16  Temp: 98.2 F (36.8 C)  TempSrc: Oral  SpO2: 99%  Weight: 191 lb (86.6 kg)  Height: 5\' 4"  (1.626 m)    Body mass index is 32.79 kg/m.  Physical Exam  Constitutional: Patient appears well-developed and well-nourished. Obese No distress.  HEENT: head atraumatic, normocephalic, pupils equal and reactive to light,  neck supple Cardiovascular: Normal rate, regular rhythm and normal heart sounds.  No murmur heard. No BLE edema. Pulmonary/Chest: Effort normal and breath sounds normal.  No respiratory distress. Abdominal: Soft.  There is no tenderness. Psychiatric: Patient has a normal mood and affect. behavior is normal. Judgment and thought content normal.  PHQ2/9: Depression screen [facility] 2/9 01/10/2021 07/12/2020 01/08/2020 04/06/2019 03/26/2019  Decreased Interest 0 0 0 0 0  Down, Depressed, Hopeless 1 0 0 1 1  PHQ - 2 Score 1 0 0 1 1  Altered sleeping 3 1 1  0 0  Tired, decreased energy 1 1 3 3 3   Change in appetite 0 1 0 0 0  Feeling bad or failure about yourself  0 1 0 0 0  Trouble concentrating 0 0 1 1 1   Moving slowly or fidgety/restless 0 0 0 0 0  Suicidal thoughts 0 0 0 0 0  PHQ-9 Score 5 4 5 5 5   Difficult doing work/chores - Not difficult at all Not difficult at all Somewhat difficult Not difficult at all  Some recent data might be hidden    phq 9 is positive   Fall Risk: Fall Risk  01/10/2021 07/12/2020 01/08/2020 04/06/2019 03/26/2019  Falls in the past year? 0 1 0 0 0  Number falls in past yr: 0 0 0 0 0  Injury with Fall? 0 1 0 0 0     Functional Status Survey: Is the patient deaf or have difficulty hearing?: No Does the patient have difficulty seeing, even when wearing glasses/contacts?: No Does the patient have difficulty concentrating, remembering, or making decisions?: No Does the patient have difficulty walking or climbing stairs?: No Does the  patient have difficulty dressing or bathing?: No Does the patient have difficulty doing errands alone such as visiting a doctor's office or shopping?: No   Assessment & Plan  1. OSA (obstructive sleep apnea)   2. Essential hypertension  - COMPLETE METABOLIC PANEL WITH GFR - valsartan-hydrochlorothiazide (DIOVAN-HCT) 160-12.5 MG tablet; TAKE 1 TABLET BY MOUTH DAILY.  Dispense: 90 tablet; Refill: 1  3. Mild episode of recurrent major depressive disorder (HCC)   4. Dyslipidemia  - Lipid panel  5. Obesity (BMI 30.0-34.9)  Discussed with the patient the risk posed by an increased BMI. Discussed importance of portion control, calorie counting and at least 150 minutes of physical activity weekly. Avoid sweet beverages and drink more water. Eat at least 6 servings of fruit and vegetables daily   6. Breast cancer screening by mammogram  - MM 3D SCREEN BREAST BILATERAL; Future  7. Hypothyroidism due to Hashimoto's thyroiditis  - TSH - levothyroxine (SYNTHROID) 125 MCG tablet; TAKE 1 TABLET BY MOUTH DAILY BEFORE BREAKFAST. ONLY 1/2 TABLET ON SUNDAY  Dispense: 90 tablet; Refill: 1

## 2021-01-10 ENCOUNTER — Encounter: Payer: Self-pay | Admitting: Family Medicine

## 2021-01-10 ENCOUNTER — Other Ambulatory Visit: Payer: Self-pay

## 2021-01-10 ENCOUNTER — Ambulatory Visit (INDEPENDENT_AMBULATORY_CARE_PROVIDER_SITE_OTHER): Payer: Self-pay | Admitting: Family Medicine

## 2021-01-10 VITALS — BP 132/78 | HR 75 | Temp 98.2°F | Resp 16 | Ht 64.0 in | Wt 191.0 lb

## 2021-01-10 DIAGNOSIS — E785 Hyperlipidemia, unspecified: Secondary | ICD-10-CM

## 2021-01-10 DIAGNOSIS — E669 Obesity, unspecified: Secondary | ICD-10-CM

## 2021-01-10 DIAGNOSIS — Z1231 Encounter for screening mammogram for malignant neoplasm of breast: Secondary | ICD-10-CM

## 2021-01-10 DIAGNOSIS — I10 Essential (primary) hypertension: Secondary | ICD-10-CM

## 2021-01-10 DIAGNOSIS — F330 Major depressive disorder, recurrent, mild: Secondary | ICD-10-CM

## 2021-01-10 DIAGNOSIS — G4733 Obstructive sleep apnea (adult) (pediatric): Secondary | ICD-10-CM

## 2021-01-10 DIAGNOSIS — E063 Autoimmune thyroiditis: Secondary | ICD-10-CM

## 2021-01-10 DIAGNOSIS — E038 Other specified hypothyroidism: Secondary | ICD-10-CM

## 2021-01-10 MED ORDER — LEVOTHYROXINE SODIUM 125 MCG PO TABS: ORAL_TABLET | ORAL | 1 refills | Status: DC

## 2021-01-10 MED ORDER — VALSARTAN-HYDROCHLOROTHIAZIDE 160-12.5 MG PO TABS: 1.0000 | ORAL_TABLET | Freq: Every day | ORAL | 1 refills | Status: DC

## 2021-01-10 NOTE — Patient Instructions (Signed)
Check with insurance about shingrix vaccine

## 2021-01-11 LAB — COMPLETE METABOLIC PANEL WITH GFR
AG Ratio: 1.8 (calc) (ref 1.0–2.5)
ALT: 15 U/L (ref 6–29)
AST: 26 U/L (ref 10–35)
Albumin: 4.2 g/dL (ref 3.6–5.1)
Alkaline phosphatase (APISO): 86 U/L (ref 37–153)
BUN: 10 mg/dL (ref 7–25)
CO2: 29 mmol/L (ref 20–32)
Calcium: 9.3 mg/dL (ref 8.6–10.4)
Chloride: 106 mmol/L (ref 98–110)
Creat: 0.62 mg/dL (ref 0.50–0.99)
GFR, Est African American: 113 mL/min/{1.73_m2} (ref >=60)
GFR, Est Non African American: 97 mL/min/{1.73_m2} (ref >=60)
Globulin: 2.4 g/dL (calc) (ref 1.9–3.7)
Glucose, Bld: 91 mg/dL (ref 65–99)
Potassium: 4.3 mmol/L (ref 3.5–5.3)
Sodium: 143 mmol/L (ref 135–146)
Total Bilirubin: 0.3 mg/dL (ref 0.2–1.2)
Total Protein: 6.6 g/dL (ref 6.1–8.1)

## 2021-01-11 LAB — LIPID PANEL
Cholesterol: 205 mg/dL — ABNORMAL HIGH (ref <200)
HDL: 50 mg/dL (ref >=50)
LDL Cholesterol (Calc): 134 mg/dL (calc) — ABNORMAL HIGH
Non-HDL Cholesterol (Calc): 155 mg/dL (calc) — ABNORMAL HIGH (ref <130)
Total CHOL/HDL Ratio: 4.1 (calc) (ref <5.0)
Triglycerides: 100 mg/dL (ref <150)

## 2021-01-11 LAB — TSH: TSH: 0.41 mIU/L (ref 0.40–4.50)

## 2021-06-27 IMAGING — MG DIGITAL SCREENING BILAT W/ TOMO W/ CAD
8 series · 8 of 24 positions shown · non-contrast
Comparison: Previous exam(s).

CLINICAL DATA: Screening.

EXAM:
DIGITAL SCREENING BILATERAL MAMMOGRAM WITH TOMO AND CAD

[R CC synth-2D]
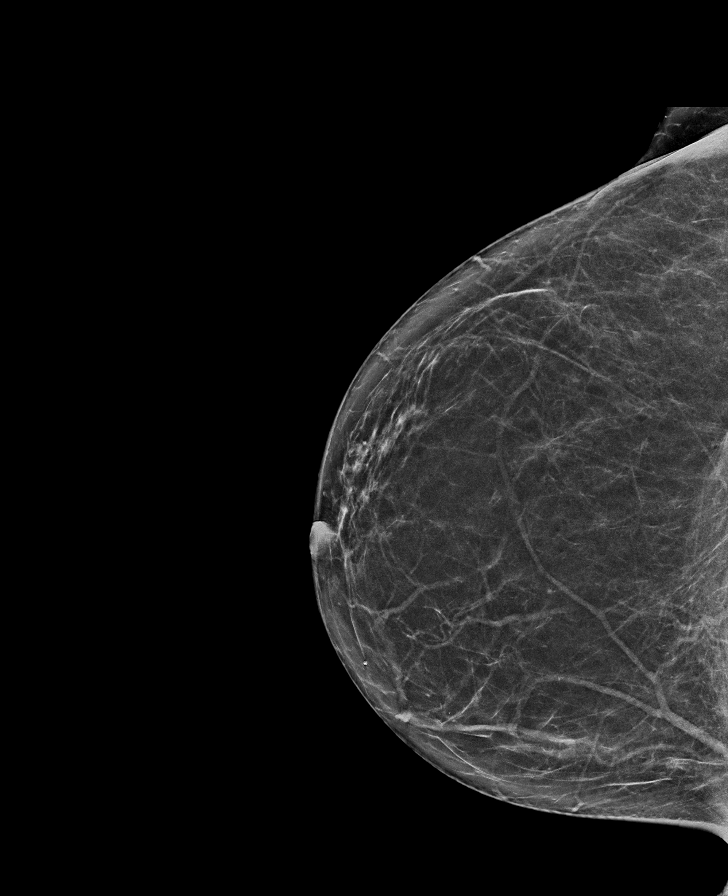

[L MLO synth-2D]
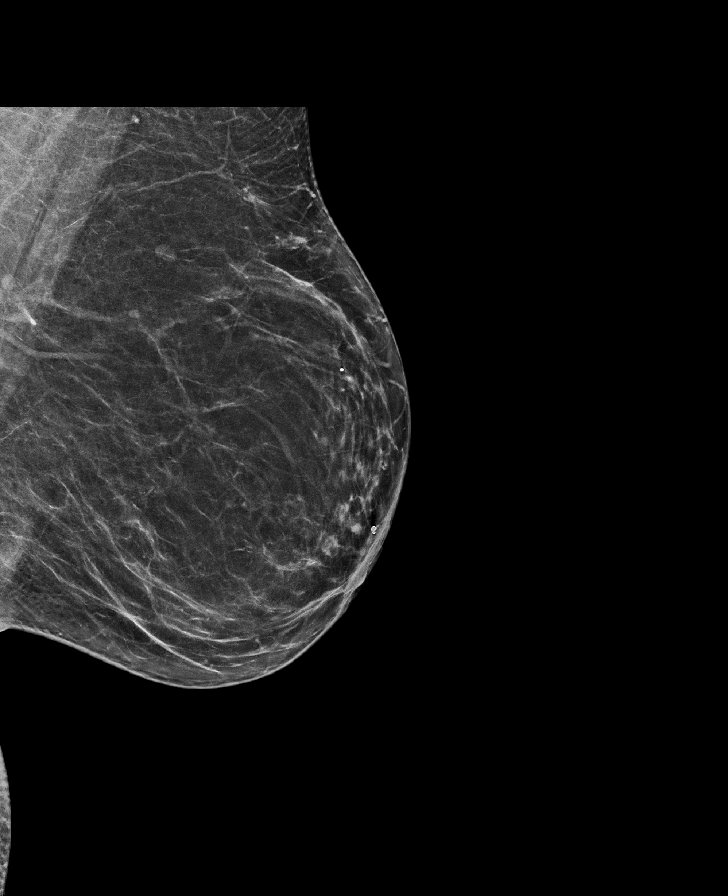

[R MLO synth-2D]
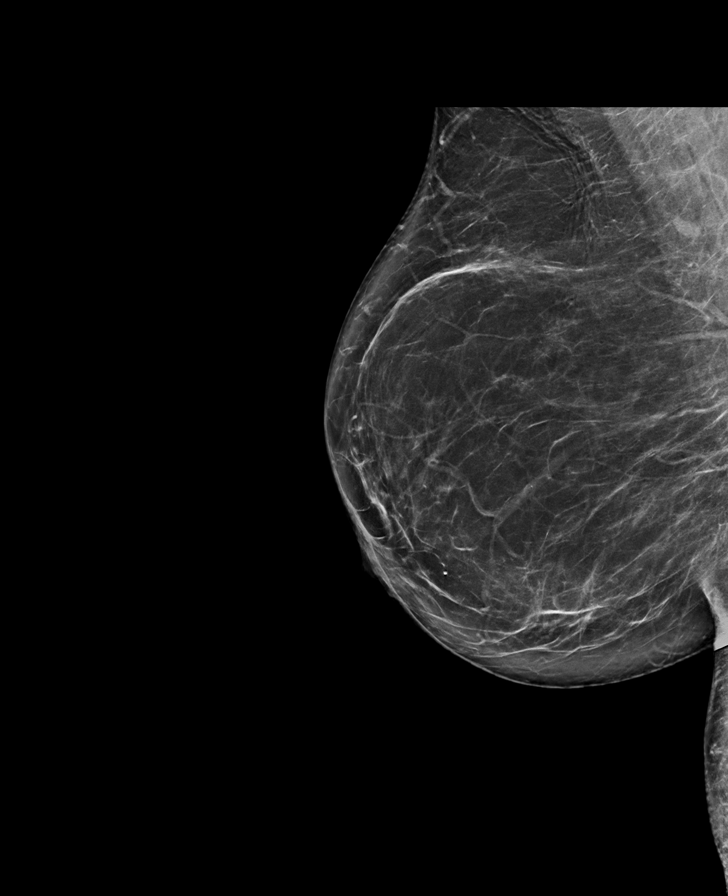

[L CC synth-2D]
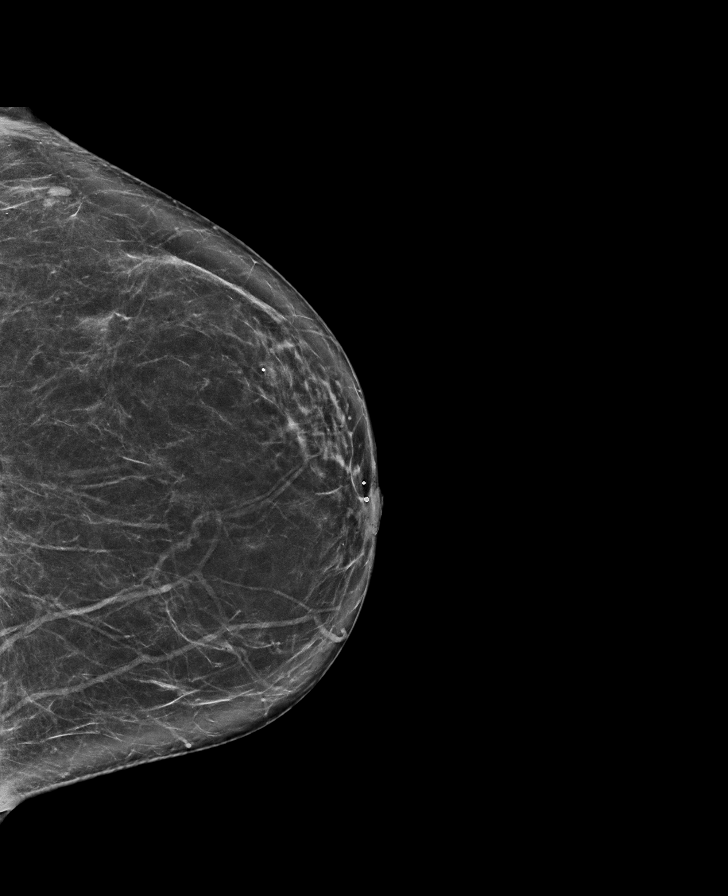

[R MLO tomo · tomo slice 43/84.0]
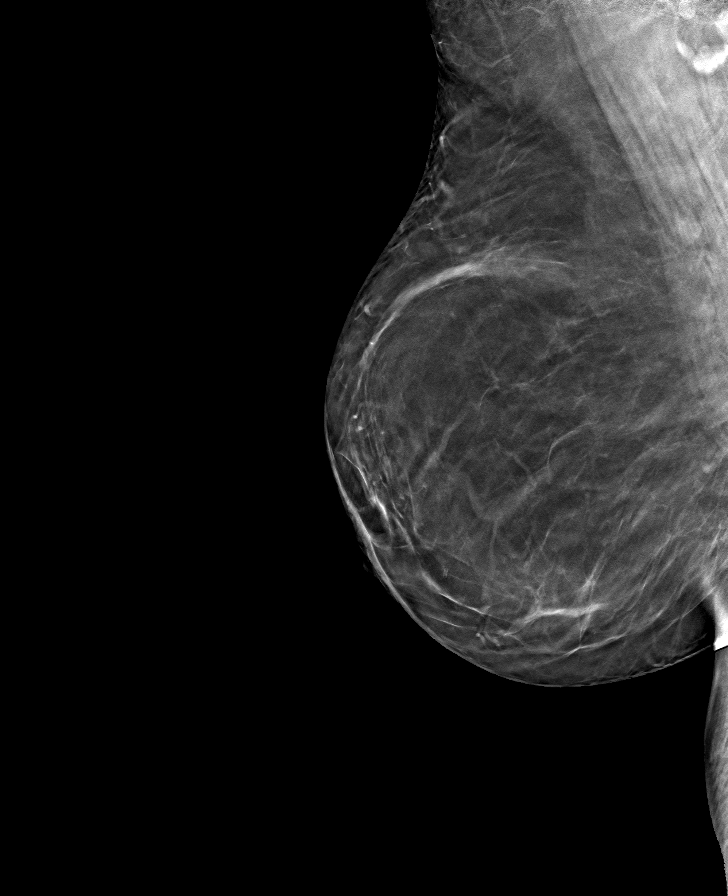

[R CC tomo · tomo slice 32/63.0]
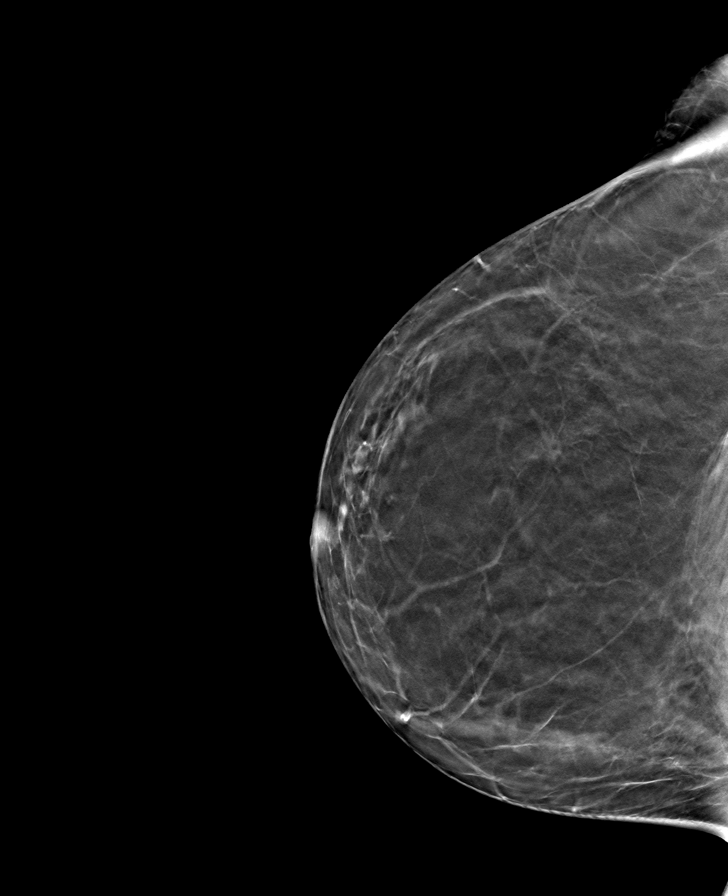

[L CC tomo · tomo slice 39/77.0]
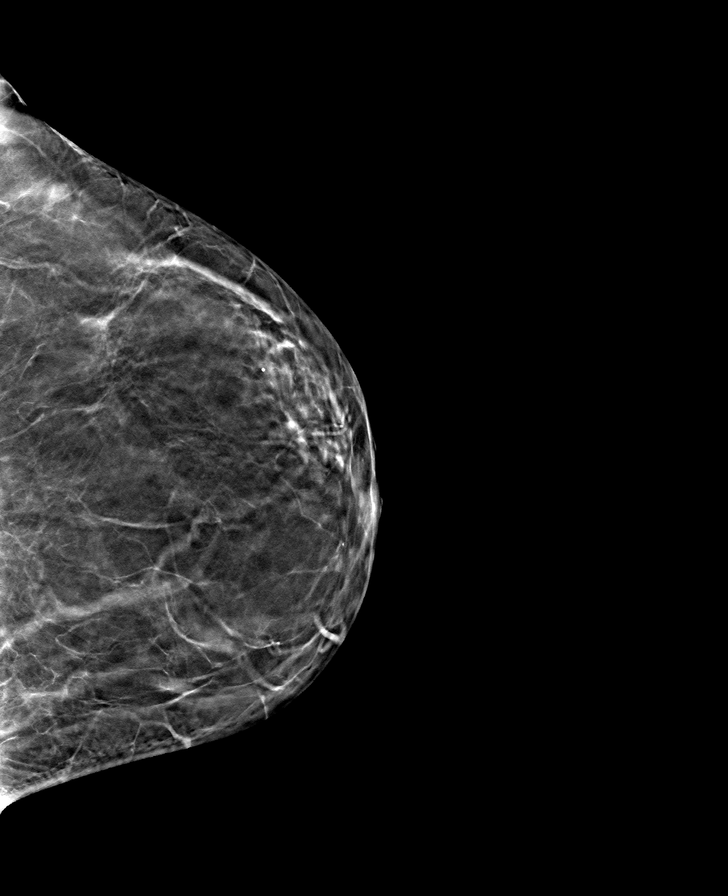

[L MLO tomo · tomo slice 35/69.0]
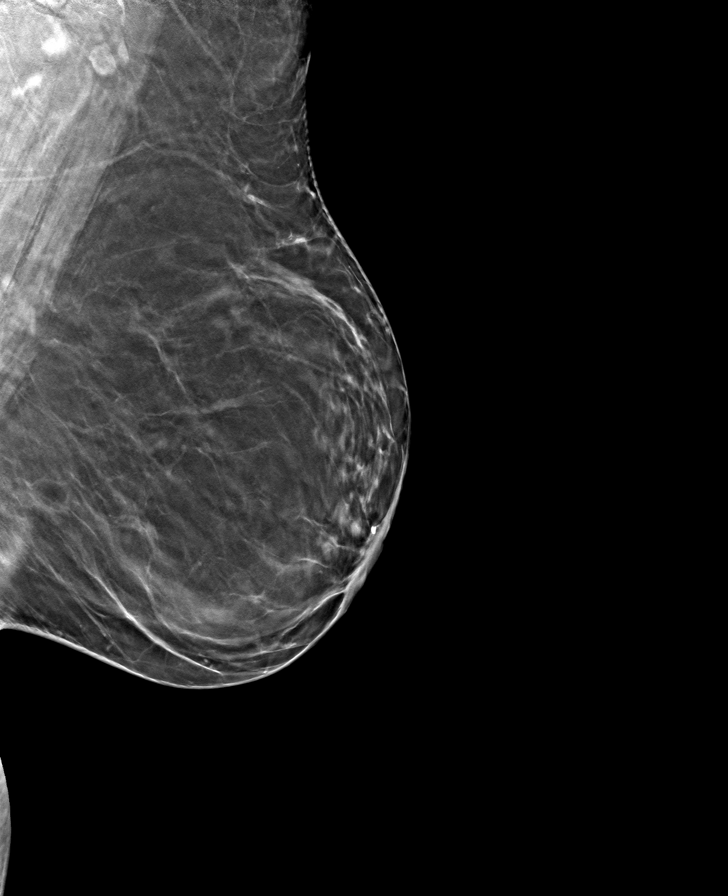

[8 of 24 positions shown; findings below may reference images not displayed]

ACR Breast Density Category b: There are scattered areas of
fibroglandular density.
FINDINGS: There are no findings suspicious for malignancy. Images were
processed with CAD.
IMPRESSION: No mammographic evidence of malignancy. A result letter of this
screening mammogram will be mailed directly to the patient.

RECOMMENDATION:
Screening mammogram in one year. (Code:[id number])

BI-RADS CATEGORY  1: Negative.

## 2021-07-12 ENCOUNTER — Ambulatory Visit: Payer: Self-pay | Admitting: Family Medicine

## 2021-07-21 NOTE — Progress Notes (Signed)
Name: Shari Long   MRN: [id number]    DOB: 10-30-1960   Date:07/24/2021       Progress Note  Subjective  Chief Complaint  Follow Up  HPI  Dyslipidemia: last LDL was down from 164 to 134  She retired, she is following a healthy diet .  The 10-year ASCVD risk score (Arnett DK, et al., 2019) is: 6.2%   Values used to calculate the score:     Age: 60 years     Sex: Female     Is Non-Hispanic African American: No     Diabetic: No     Tobacco smoker: No     Systolic Blood Pressure: 132 mmHg     Is BP treated: Yes     HDL Cholesterol: 50 mg/dL     Total Cholesterol: 205 mg/dL    Hypothyroidism: she is taking Synthroid,  no change in bowel movement, mild dry skin, she feels cold on a regular basis - but that is her baseline .  Last TSH was at goal - 0.41   Obesity:. She was doing great on keto diet , weight went down to low 180's lbs, last Nov she was 201 lbs , weight has been stable over the past 6 months . She is contemplating resuming a keto diet again   HTN: she is taking medication and denies side effects, no chest pain, palpitation. BP is at goal . At home bp 120-130/70-80's  Abdominal pain: started after she had a Frosty the day before Thanksgiving , she is lactose intolerant and had left lower quadrant pain, lasted about 10 days. She has diverticulosis on last colonoscopy. Aggravated by bowel movement and associated with vomiting. Symptoms lasted about 10 days and only cleared up after she only drank clear liquids    Major Depression:  she states her symptoms are usually worse in the winter she has SAD - she is trying to stay outdoors , she also has an UV light at home.  She took Wellbutrin for a while weaned self off medication a few years ago. She also tried Duloxetine but made her feel dizzy and nauseated. Phq 9 is negative except for episodes of fatigue. Discussed resuming Wellbutrin seasonally but at this time she is not interested    OSA: she was unable to tolerate CPAP  machine. She is always tired during the day, explained that CPAP can help with fatigue and also memory and mental fogginess. She is not interested in trying again . Unchanged    Paresthesia: started in 2020, initially both arms, however progressed to mostly on left arm, flank and leg. She was seen by Dr. Sherryll Burger ( neurologist ) had multiple tests done, including MRI C-spine and Brain. Negative for MS. .She states symptoms not as severe lately, also states not as frequent as it used to be, mostly  intermittent paresthesia. She saw Rheumatologist but was given reassurance Unchanged   C-spine MRI 08/2019:  Multilevel degenerative changes as detailed above. There is no high-grade canal stenosis. 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  Syncopal episode: it happened Fall 2021  she noticed a wave of nausea followed by syncopal episode, she had some palpitation and diaphoresis , woke up on the floor, hit her face on something, she vomited, was able to get up and clean the floor. She states son checked bp and it was low, she felt tired and weak the rest of the day, nausea resolved after she vomited. 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   Paresthesia and pain of left extremity 02/04/2020   Paresthesia 04/06/2019   OSA (obstructive sleep apnea) 04/01/2017   Dyslipidemia 10/02/2016   Hypothyroidism due to Hashimoto's thyroiditis 03/29/2016   Major depression in remission (HCC) 03/29/2016   GERD without esophagitis 03/29/2016   Status post vaginal hysterectomy 11/24/2015   Vaginal atrophy 11/24/2015   HTN (hypertension) 12/25/2010    Past Surgical History:  Procedure Laterality Date   CARDIAC CATHETERIZATION     COLONOSCOPY WITH PROPOFOL N/A 01/26/2020   Procedure: COLONOSCOPY WITH PROPOFOL;  Surgeon: Midge Minium, MD;  Location[facility] ENDOSCOPY;  Service: Endoscopy;  Laterality: N/A;   FOOT  SURGERY  1978   left foot    TONSILLECTOMY     VAGINAL HYSTERECTOMY  2002    Family History  Problem Relation Age of Onset   Cancer - Cervical Mother    Diabetes Mother    Thyroid disease Sister    Breast cancer Paternal Uncle 2   Colon cancer Paternal Uncle    Breast cancer Paternal Grandmother 19   Breast cancer Cousin        ovarian (50's), breast(50's) and uterine(60's) cancer on maternal side.    Ovarian cancer Cousin    Heart disease Maternal Grandmother    Heart disease Paternal Grandfather    Breast cancer Other        great pat aunts x 2   Breast cancer Other        great pat uncle    Social History   Tobacco Use   Smoking status: Never   Smokeless tobacco: Never  Substance Use Topics   Alcohol use: Not Currently    Alcohol/week: 0.0 standard drinks    Comment: rare     Current Outpatient Medications:    ascorbic acid (VITAMIN C) 500 MG tablet, , Disp: , Rfl:    aspirin 81 MG tablet, Take 81 mg by mouth daily., Disp: , Rfl:    calcium citrate-vitamin D 200-200 MG-UNIT TABS, Take 1 tablet by mouth daily., Disp: , Rfl:    L-Lysine 1000 MG TABS, Take by mouth 2 (two) times daily., Disp: , Rfl:    levothyroxine (SYNTHROID) 125 MCG tablet, TAKE 1 TABLET BY MOUTH DAILY BEFORE BREAKFAST. ONLY 1/2 TABLET ON SUNDAY, Disp: 90 tablet, Rfl: 1   Multiple Vitamins tablet, , Disp: , Rfl:    Omega-3 Fatty Acids (RA FISH OIL) 1000 MG CAPS, Take by mouth., Disp: , Rfl:    valsartan-hydrochlorothiazide (DIOVAN-HCT) 160-12.5 MG tablet, TAKE 1 TABLET BY MOUTH DAILY., Disp: 90 tablet, Rfl: 1   vitamin E 400 UNIT capsule, Take 400 Units by mouth daily., Disp: , Rfl:   Allergies  Allergen Reactions   Latex Rash    Gloves when worn over extended period   Penicillins Rash    I personally reviewed active problem list, medication list, allergies, family history, social history, health maintenance with the patient/caregiver today.   ROS  Constitutional: Negative for fever or  weight change.  Respiratory: Negative for cough and shortness of breath.   Cardiovascular: Negative for chest pain or palpitations.  Gastrointestinal: Negative for abdominal pain, no bowel changes.  Musculoskeletal: Negative for gait problem or joint swelling.  Skin: Negative for rash.  Neurological: Negative for dizziness or headache.  No other specific complaints in a complete review of systems (except as listed in HPI above).  Objective  Vitals:   07/24/21 0815  BP: 132/82  Pulse: 72  Resp: 16  Temp: 97.6 F (36.4 C)  Weight: 194 lb (88 kg)  Height: 5\' 4"  (1.626 m)    Body mass index is 33.3 kg/m.  Physical Exam  Constitutional: Patient appears well-developed and well-nourished. Obese  No distress.  HEENT: head atraumatic, normocephalic, pupils equal and reactive to light, neck supple Cardiovascular: Normal rate, regular rhythm and normal heart sounds.  No murmur heard. No BLE edema. Pulmonary/Chest: Effort normal and breath sounds normal. No respiratory distress. Abdominal: Soft.  There is no tenderness. Psychiatric: Patient has a normal mood and affect. behavior is normal. Judgment and thought content normal.   PHQ2/9: Depression screen [facility] 2/9 07/24/2021 01/10/2021 07/12/2020 01/08/2020 04/06/2019  Decreased Interest 0 0 0 0 0  Down, Depressed, Hopeless 0 1 0 0 1  PHQ - 2 Score 0 1 0 0 1  Altered sleeping 0 3 1 1  0  Tired, decreased energy 3 1 1 3 3   Change in appetite 0 0 1 0 0  Feeling bad or failure about yourself  0 0 1 0 0  Trouble concentrating 1 0 0 1 1  Moving slowly or fidgety/restless 0 0 0 0 0  Suicidal thoughts 0 0 0 0 0  PHQ-9 Score 4 5 4 5 5   Difficult doing work/chores - - Not difficult at all Not difficult at all Somewhat difficult  Some recent data might be hidden    phq 9 is negative    Fall Risk: Fall Risk  07/24/2021 01/10/2021 07/12/2020 01/08/2020 04/06/2019  Falls in the past year? 1 0 1 0 0  Number falls in past yr: 0 0 0 0 0  Injury  with Fall? 0 0 1 0 0  Risk for fall due to : No Fall Risks - - - -  Follow up Falls prevention discussed - - - -      Functional Status Survey: Is the patient deaf or have difficulty hearing?: No Does the patient have difficulty seeing, even when wearing glasses/contacts?: Yes Does the patient have difficulty concentrating, remembering, or making decisions?: Yes Does the patient have difficulty walking or climbing stairs?: No Does the patient have difficulty dressing or bathing?: No Does the patient have difficulty doing errands alone such as visiting a doctor's office or shopping?: No    Assessment & Plan  1. Dyslipidemia  On life style modification   2. Hypothyroidism due to Hashimoto's thyroiditis  - levothyroxine (SYNTHROID) 125 MCG tablet; TAKE 1 TABLET BY MOUTH DAILY BEFORE BREAKFAST. ONLY 1/2 TABLET ON SUNDAY  Dispense: 90 tablet; Refill: 1  3. Major depression in remission [facility])  Doing well at this time  4. Essential hypertension  - valsartan-hydrochlorothiazide (DIOVAN-HCT) 160-12.5 MG tablet; TAKE 1 TABLET BY MOUTH DAILY.  Dispense: 90 tablet; Refill: 1  5. Obesity (BMI 30.0-34.9)  Discussed with the patient the risk posed by an increased BMI. Discussed importance of portion control, calorie counting and at least 150 minutes of physical activity weekly. Avoid sweet beverages and drink more water. Eat at least 6 servings of fruit and vegetables daily    6. OSA (obstructive sleep apnea)

## 2021-07-24 ENCOUNTER — Encounter: Payer: Self-pay | Admitting: Family Medicine

## 2021-07-24 ENCOUNTER — Other Ambulatory Visit: Payer: Self-pay

## 2021-07-24 ENCOUNTER — Ambulatory Visit: Payer: BLUE CROSS/BLUE SHIELD | Admitting: Family Medicine

## 2021-07-24 VITALS — BP 132/82 | HR 72 | Temp 97.6°F | Resp 16 | Ht 64.0 in | Wt 194.0 lb

## 2021-07-24 DIAGNOSIS — E038 Other specified hypothyroidism: Secondary | ICD-10-CM | POA: Diagnosis not present

## 2021-07-24 DIAGNOSIS — E669 Obesity, unspecified: Secondary | ICD-10-CM

## 2021-07-24 DIAGNOSIS — E063 Autoimmune thyroiditis: Secondary | ICD-10-CM

## 2021-07-24 DIAGNOSIS — E785 Hyperlipidemia, unspecified: Secondary | ICD-10-CM

## 2021-07-24 DIAGNOSIS — I10 Essential (primary) hypertension: Secondary | ICD-10-CM

## 2021-07-24 DIAGNOSIS — G4733 Obstructive sleep apnea (adult) (pediatric): Secondary | ICD-10-CM

## 2021-07-24 DIAGNOSIS — F325 Major depressive disorder, single episode, in full remission: Secondary | ICD-10-CM | POA: Diagnosis not present

## 2021-07-24 MED ORDER — VALSARTAN-HYDROCHLOROTHIAZIDE 160-12.5 MG PO TABS: 1.0000 | ORAL_TABLET | Freq: Every day | ORAL | 1 refills | Status: DC

## 2021-07-24 MED ORDER — LEVOTHYROXINE SODIUM 125 MCG PO TABS: ORAL_TABLET | ORAL | 1 refills | Status: DC

## 2021-11-16 ENCOUNTER — Encounter: Payer: BLUE CROSS/BLUE SHIELD | Admitting: Family Medicine

## 2022-01-23 ENCOUNTER — Ambulatory Visit: Payer: BLUE CROSS/BLUE SHIELD | Admitting: Family Medicine

## 2022-01-24 NOTE — Progress Notes (Signed)
Name: Shari Long   MRN: [id number]    DOB: Feb 19, 1961   Date:01/25/2022       Progress Note  Subjective  Chief Complaint  Annual Exam  HPI  Patient presents for annual CPE and follow up  Dyslipidemia: last LDL improved and we will recheck it today    The 10-year ASCVD risk score (Arnett DK, et al., 2019) is: 5.7%   Values used to calculate the score:     Age: 61 years     Sex: Female     Is Non-Hispanic African American: No     Diabetic: No     Tobacco smoker: No     Systolic Blood Pressure: 342 mmHg     Is BP treated: Yes     HDL Cholesterol: 50 mg/dL     Total Cholesterol: 205 mg/dL    Hypothyroidism: she is taking Synthroid,  no change in bowel movement, mild dry skin, she used to feel cold all the time but has been feeling hot lately  . We will recheck TSH today, she is compliant with medication    Obesity. She was doing great on keto diet , weight went down to low 180's lbs, last Nov she was 201 lbs , weight has been stable over the past  couple years now     HTN: she is taking medication and denies side effects, no chest pain, palpitation. BP has been at goal    Major Depression:  she states her symptoms are usually worse in the winter she has SAD - she is trying to stay outdoors , she also has an UV light at home.  She took Wellbutrin for a while weaned self off medication a few years ago. She also tried Duloxetine but made her feel dizzy and nauseated. Phq 9 is negative except for episodes of fatigue.  She states always better during Summer months    OSA: she was unable to tolerate CPAP machine. She is always tired during the day, explained that CPAP can help with fatigue and also memory and mental fogginess. She is not interested in trying again . Unchanged    Paresthesia: started in 2020, initially both arms, however progressed to mostly on left arm, flank and leg. She was seen by Dr. Manuella Ghazi ( neurologist ) had multiple tests done, including MRI C-spine and Brain.  Negative for MS. .She states symptoms not as severe lately, also states not as frequent as it used to be, mostly  intermittent paresthesia. She saw Rheumatologist but was given reassurance Symptoms are stable and unchanged    C-spine MRI 08/2019:   Multilevel degenerative changes as detailed above. There is no high-grade canal stenosis. 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   Diet: she tries to eat a balanced diet most of the time  Exercise:  discussed 150 minutes per week  [place] Visit from [facility]/10/[facility]  AUDIT-C Score 0      Depression: Phq 9 is  negative    01/25/2022    8:16 AM 07/24/2021    8:15 AM 01/10/2021    9:19 AM 07/12/2020    7:33 AM 01/08/2020    7:54 AM  Depression screen PHQ 2/9  Decreased Interest 0 0 0 0 0  Down, Depressed, Hopeless 0 0 1 0 0  PHQ - 2 Score 0 0 1 0 0  Altered sleeping 1 0 3 1 1   Tired, decreased energy 3 3 1 1 3   Change in  appetite 0 0 0 1 0  Feeling bad or failure about yourself  0 0 0 1 0  Trouble concentrating 0 1 0 0 1  Moving slowly or fidgety/restless 0 0 0 0 0  Suicidal thoughts 0 0 0 0 0  PHQ-9 Score 4 4 5 4 5   Difficult doing work/chores    Not difficult at all Not difficult at all   Hypertension: BP Readings from Last 3 Encounters:  01/25/22 126/78  07/24/21 132/82  01/10/21 132/78   Obesity: Wt Readings from Last 3 Encounters:  01/25/22 193 lb (87.5 kg)  07/24/21 194 lb (88 kg)  01/10/21 191 lb (86.6 kg)   BMI Readings from Last 3 Encounters:  01/25/22 33.13 kg/m  07/24/21 33.30 kg/m  01/10/21 32.79 kg/m     Vaccines:    Tdap: up to date 2021  Shingrix: refused  Pneumonia: N/A Flu: discussed  COVID-19: refused    Hep C Screening: 03/29/16 STD testing and prevention (HIV/chl/gon/syphilis): 08/15/95 Intimate partner violence: negative screen  Sexual History : not sexually active - he also has ED Menstrual History/LMP/Abnormal Bleeding: s/p menopause/hysterectomy   Discussed importance of follow up if any post-menopausal bleeding: not applicable  Incontinence Symptoms: negative for symptoms   Breast cancer:  - Last Mammogram: Ordered today - BRCA gene screening: N/A  Osteoporosis Prevention : Discussed high calcium and vitamin D supplementation, weight bearing exercises Bone density  she wants to wait until age 70   Cervical cancer screening: N/A s/p hysterectomy   Skin cancer: Discussed monitoring for atypical lesions  Colorectal cancer: 01/26/20   Lung cancer:  Low Dose CT Chest recommended if Age 78-80 years, 20 pack-year currently smoking OR have quit w/in 15years. Patient does not qualify for screen   ECG: 12/25/10  Advanced Care Planning: A voluntary discussion about advance care planning including the explanation and discussion of advance directives.  Discussed health care proxy and Living will, and the patient was able to identify a health care proxy as son .  Patient does not have a living will and power of attorney of health care   Lipids: Lab Results  Component Value Date   CHOL 205 (H) 01/10/2021   CHOL 242 (H) 01/08/2020   CHOL 246 (H) 01/08/2019   Lab Results  Component Value Date   HDL 50 01/10/2021   HDL 50 01/08/2020   HDL 46 (L) 01/08/2019   Lab Results  Component Value Date   LDLCALC 134 (H) 01/10/2021   LDLCALC 164 (H) 01/08/2020   LDLCALC 170 (H) 01/08/2019   Lab Results  Component Value Date   TRIG 100 01/10/2021   TRIG 140 01/08/2020   TRIG 156 (H) 01/08/2019   Lab Results  Component Value Date   CHOLHDL 4.1 01/10/2021   CHOLHDL 4.8 01/08/2020   CHOLHDL 5.3 (H) 01/08/2019   No results found for: "LDLDIRECT"  Glucose: Glucose  Date Value Ref Range Status  10/14/2013 83 65 - 99 mg/dL Final   Glucose, Bld  Date Value Ref Range Status  01/10/2021 91 65 - 99 mg/dL Final    Comment:    .            Fasting reference interval .   01/08/2020 101 (H) 65 - 99 mg/dL Final    Comment:    .             Fasting reference interval . For someone without known diabetes, a glucose value between 100 and 125 mg/dL is consistent with  prediabetes and should be confirmed with a follow-up test. .   01/08/2019 110 (H) 65 - 99 mg/dL Final    Comment:    .            Fasting reference interval . For someone without known diabetes, a glucose value between 100 and 125 mg/dL is consistent with prediabetes and should be confirmed with a follow-up test. .     Patient Active Problem List   Diagnosis Date Noted   Anti-RNP antibodies present 02/04/2020   Paresthesia and pain of left extremity 02/04/2020   OSA (obstructive sleep apnea) 04/01/2017   Dyslipidemia 10/02/2016   Hypothyroidism due to Hashimoto's thyroiditis 03/29/2016   Major depression in remission ([place]) 03/29/2016   GERD without esophagitis 03/29/2016   Status post vaginal hysterectomy 11/24/2015   Vaginal atrophy 11/24/2015   Essential hypertension 12/25/2010    Past Surgical History:  Procedure Laterality Date   CARDIAC CATHETERIZATION     COLONOSCOPY WITH PROPOFOL N/A 01/26/2020   Procedure: COLONOSCOPY WITH PROPOFOL;  Surgeon: Lucilla Lame, MD;  Location: [facility] ENDOSCOPY;  Service: Endoscopy;  Laterality: N/A;   FOOT SURGERY  1978   left foot    TONSILLECTOMY     VAGINAL HYSTERECTOMY  2002    Family History  Problem Relation Age of Onset   Cancer - Cervical Mother    Diabetes Mother    Thyroid disease Sister    Breast cancer Paternal Uncle 4   Colon cancer Paternal Uncle    Breast cancer Paternal Grandmother 94   Breast cancer Cousin        ovarian (75's), breast(50's) and uterine(60's) cancer on maternal side.    Ovarian cancer Cousin    Heart disease Maternal Grandmother    Heart disease Paternal Grandfather    Breast cancer Other        great pat aunts x 2   Breast cancer Other        great pat uncle    Social History   Socioeconomic History   Marital status: Married    Spouse name: Richardson Landry   Number  of children: 2   Years of education: Not on file   Highest education level: Master's degree (e.g., MA, MS, MEng, MEd, MSW, MBA)  Occupational History   Occupation: Hydrographic surveyor: [employer]    Comment: cancer center  Tobacco Use   Smoking status: Never   Smokeless tobacco: Never  Vaping Use   Vaping Use: Never used  Substance and Sexual Activity   Alcohol use: Not Currently    Alcohol/week: 0.0 standard drinks of alcohol    Comment: rare   Drug use: No   Sexual activity: Yes    Partners: Male    Birth control/protection: Surgical  Other Topics Concern   Not on file  Social History Narrative   She was separated from husband in 2005 but reconciled .    Works as Marine scientist at [employer]   Two grown sons and two grandchildren.    Social Determinants of Health   Financial Resource Strain: Low Risk  (01/25/2022)   Overall Financial Resource Strain (CARDIA)    Difficulty of Paying Living Expenses: Not hard at all  Food Insecurity: No Food Insecurity (01/25/2022)   Hunger Vital Sign    Worried About Running Out of Food in the Last Year: Never true    Ran Out of Food in the Last Year: Never true  Transportation Needs: No Transportation Needs (01/25/2022)   PRAPARE -  Hydrologist (Medical): No    Lack of Transportation (Non-Medical): No  Physical Activity: Insufficiently Active (01/25/2022)   Exercise Vital Sign    Days of Exercise per Week: 2 days    Minutes of Exercise per Session: 20 min  Stress: No Stress Concern Present (01/25/2022)   [place]    Feeling of Stress : Not at all  Social Connections: [place] (01/25/2022)   Social Connection and Isolation Panel [NHANES]    Frequency of Communication with Friends and Family: More than three times a week    Frequency of Social Gatherings with Friends and Family: Twice a week    Attends Religious Services: More than 4 times per  year    Active Member of [employer] or Organizations: Yes    Attends Archivist Meetings: Never    Marital Status: Married  Human resources officer Violence: Not At Risk (01/25/2022)   Humiliation, Afraid, Rape, and Kick questionnaire    Fear of Current or Ex-Partner: No    Emotionally Abused: No    Physically Abused: No    Sexually Abused: No     Current Outpatient Medications:    ascorbic acid (VITAMIN C) 500 MG tablet, 500 mg 2 (two) times daily., Disp: , Rfl:    aspirin 81 MG tablet, Take 81 mg by mouth daily., Disp: , Rfl:    calcium citrate-vitamin D 200-200 MG-UNIT TABS, Take 1 tablet by mouth daily., Disp: , Rfl:    L-Lysine 1000 MG TABS, Take by mouth 2 (two) times daily., Disp: , Rfl:    Multiple Vitamins tablet, , Disp: , Rfl:    Omega-3 Fatty Acids (RA FISH OIL) 1000 MG CAPS, Take by mouth., Disp: , Rfl:    vitamin E 400 UNIT capsule, Take 400 Units by mouth daily., Disp: , Rfl:    levothyroxine (SYNTHROID) 125 MCG tablet, TAKE 1 TABLET BY MOUTH DAILY BEFORE BREAKFAST. ONLY 1/2 TABLET ON SUNDAY, Disp: 90 tablet, Rfl: 1   valsartan-hydrochlorothiazide (DIOVAN-HCT) 160-12.5 MG tablet, TAKE 1 TABLET BY MOUTH DAILY., Disp: 90 tablet, Rfl: 1  Allergies  Allergen Reactions   Latex Rash    Gloves when worn over extended period   Penicillins Rash     ROS  Constitutional: Negative for fever or weight change.  Respiratory: Negative for cough and shortness of breath.   Cardiovascular: Negative for chest pain or palpitations.  Gastrointestinal: Negative for abdominal pain, no bowel changes.  Musculoskeletal: Negative for gait problem or joint swelling.  Skin: Negative for rash.  Neurological: Negative for dizziness or headache.  No other specific complaints in a complete review of systems (except as listed in HPI above).   Objective  Vitals:   01/25/22 0816  BP: 126/78  Pulse: 78  Resp: 16  SpO2: 98%  Weight: 193 lb (87.5 kg)  Height: 5' 4"  (1.626 m)    Body mass  index is 33.13 kg/m.  Physical Exam  Constitutional: Patient appears well-developed and well-nourished. Obese  No distress.  HEENT: head atraumatic, normocephalic, pupils equal and reactive to light, neck supple Cardiovascular: Normal rate, regular rhythm and normal heart sounds.  No murmur heard. No BLE edema. Pulmonary/Chest: Effort normal and breath sounds normal. No respiratory distress. Abdominal: Soft.  There is no tenderness. Psychiatric: Patient has a normal mood and affect. behavior is normal. Judgment and thought content normal.   Fall Risk:    01/25/2022    8:16 AM 07/24/2021  8:15 AM 01/10/2021    9:19 AM 07/12/2020    7:33 AM 01/08/2020    7:54 AM  Fall Risk   Falls in the past year? 0 1 0 1 0  Number falls in past yr: 0 0 0 0 0  Injury with Fall? 0 0 0 1 0  Risk for fall due to : No Fall Risks No Fall Risks     Follow up Falls prevention discussed Falls prevention discussed        Functional Status Survey: Is the patient deaf or have difficulty hearing?: No Does the patient have difficulty seeing, even when wearing glasses/contacts?: No Does the patient have difficulty concentrating, remembering, or making decisions?: No Does the patient have difficulty walking or climbing stairs?: No Does the patient have difficulty dressing or bathing?: No Does the patient have difficulty doing errands alone such as visiting a doctor's office or shopping?: No   Assessment & Plan  1. Well adult exam  - MM 3D SCREEN BREAST BILATERAL; Future - Lipid panel - COMPLETE METABOLIC PANEL WITH GFR - TSH  2. Breast cancer screening by mammogram  - MM 3D SCREEN BREAST BILATERAL; Future  3. Hypothyroidism due to Hashimoto's thyroiditis  - TSH - levothyroxine (SYNTHROID) 125 MCG tablet; TAKE 1 TABLET BY MOUTH DAILY BEFORE BREAKFAST. ONLY 1/2 TABLET ON SUNDAY  Dispense: 90 tablet; Refill: 1  4. Major depression in remission ([place])   5. Essential hypertension  - COMPLETE  METABOLIC PANEL WITH GFR - valsartan-hydrochlorothiazide (DIOVAN-HCT) 160-12.5 MG tablet; TAKE 1 TABLET BY MOUTH DAILY.  Dispense: 90 tablet; Refill: 1  6. OSA (obstructive sleep apnea)  Not able to tolerate CPAP   7. Dyslipidemia  - Lipid panel   -USPSTF grade A and B recommendations reviewed with patient; age-appropriate recommendations, preventive care, screening tests, etc discussed and encouraged; healthy living encouraged; see AVS for patient education given to patient -Discussed importance of 150 minutes of physical activity weekly, eat two servings of fish weekly, eat one serving of tree nuts ( cashews, pistachios, pecans, almonds.Marland Kitchen) every other day, eat 6 servings of fruit/vegetables daily and drink plenty of water and avoid sweet beverages.   -Reviewed Health Maintenance: Yes.

## 2022-01-24 NOTE — Patient Instructions (Signed)
Preventive Care 40-64 Years Old, Female Preventive care refers to lifestyle choices and visits with your health care provider that can promote health and wellness. Preventive care visits are also called wellness exams. What can I expect for my preventive care visit? Counseling Your health care provider may ask you questions about your: Medical history, including: Past medical problems. Family medical history. Pregnancy history. Current health, including: Menstrual cycle. Method of birth control. Emotional well-being. Home life and relationship well-being. Sexual activity and sexual health. Lifestyle, including: Alcohol, nicotine or tobacco, and drug use. Access to firearms. Diet, exercise, and sleep habits. Work and work environment. Sunscreen use. Safety issues such as seatbelt and bike helmet use. Physical exam Your health care provider will check your: Height and weight. These may be used to calculate your BMI (body mass index). BMI is a measurement that tells if you are at a healthy weight. Waist circumference. This measures the distance around your waistline. This measurement also tells if you are at a healthy weight and may help predict your risk of certain diseases, such as type 2 diabetes and high blood pressure. Heart rate and blood pressure. Body temperature. Skin for abnormal spots. What immunizations do I need?  Vaccines are usually given at various ages, according to a schedule. Your health care provider will recommend vaccines for you based on your age, medical history, and lifestyle or other factors, such as travel or where you work. What tests do I need? Screening Your health care provider may recommend screening tests for certain conditions. This may include: Lipid and cholesterol levels. Diabetes screening. This is done by checking your blood sugar (glucose) after you have not eaten for a while (fasting). Pelvic exam and Pap test. Hepatitis B test. Hepatitis C  test. HIV (human immunodeficiency virus) test. STI (sexually transmitted infection) testing, if you are at risk. Lung cancer screening. Colorectal cancer screening. Mammogram. Talk with your health care provider about when you should start having regular mammograms. This may depend on whether you have a family history of breast cancer. BRCA-related cancer screening. This may be done if you have a family history of breast, ovarian, tubal, or peritoneal cancers. Bone density scan. This is done to screen for osteoporosis. Talk with your health care provider about your test results, treatment options, and if necessary, the need for more tests. Follow these instructions at home: Eating and drinking  Eat a diet that includes fresh fruits and vegetables, whole grains, lean protein, and low-fat dairy products. Take vitamin and mineral supplements as recommended by your health care provider. Do not drink alcohol if: Your health care provider tells you not to drink. You are pregnant, may be pregnant, or are planning to become pregnant. If you drink alcohol: Limit how much you have to 0-1 drink a day. Know how much alcohol is in your drink. In the [place] one drink equals one 12 oz bottle of beer (355 mL), one 5 oz glass of wine (148 mL), or one 1 oz glass of hard liquor (44 mL). Lifestyle Brush your teeth every morning and night with fluoride toothpaste. Floss one time each day. Exercise for at least 30 minutes 5 or more days each week. Do not use any products that contain nicotine or tobacco. These products include cigarettes, chewing tobacco, and vaping devices, such as e-cigarettes. If you need help quitting, ask your health care provider. Do not use drugs. If you are sexually active, practice safe sex. Use a condom or other form of protection to   prevent STIs. If you do not wish to become pregnant, use a form of birth control. If you plan to become pregnant, see your health care provider for a  prepregnancy visit. Take aspirin only as told by your health care provider. Make sure that you understand how much to take and what form to take. Work with your health care provider to find out whether it is safe and beneficial for you to take aspirin daily. Find healthy ways to manage stress, such as: Meditation, yoga, or listening to music. Journaling. Talking to a trusted person. Spending time with friends and family. Minimize exposure to UV radiation to reduce your risk of skin cancer. Safety Always wear your seat belt while driving or riding in a vehicle. Do not drive: If you have been drinking alcohol. Do not ride with someone who has been drinking. When you are tired or distracted. While texting. If you have been using any mind-altering substances or drugs. Wear a helmet and other protective equipment during sports activities. If you have firearms in your house, make sure you follow all gun safety procedures. Seek help if you have been physically or sexually abused. What's next? Visit your health care provider once a year for an annual wellness visit. Ask your health care provider how often you should have your eyes and teeth checked. Stay up to date on all vaccines. This information is not intended to replace advice given to you by your health care provider. Make sure you discuss any questions you have with your health care provider. Document Revised: 01/25/2021 Document Reviewed: 01/25/2021 Elsevier Patient Education  [place].

## 2022-01-25 ENCOUNTER — Encounter: Payer: Self-pay | Admitting: Family Medicine

## 2022-01-25 ENCOUNTER — Ambulatory Visit (INDEPENDENT_AMBULATORY_CARE_PROVIDER_SITE_OTHER): Payer: 59 | Admitting: Family Medicine

## 2022-01-25 VITALS — BP 126/78 | HR 78 | Resp 16 | Ht 64.0 in | Wt 193.0 lb

## 2022-01-25 DIAGNOSIS — E038 Other specified hypothyroidism: Secondary | ICD-10-CM | POA: Diagnosis not present

## 2022-01-25 DIAGNOSIS — I10 Essential (primary) hypertension: Secondary | ICD-10-CM | POA: Diagnosis not present

## 2022-01-25 DIAGNOSIS — E785 Hyperlipidemia, unspecified: Secondary | ICD-10-CM

## 2022-01-25 DIAGNOSIS — E063 Autoimmune thyroiditis: Secondary | ICD-10-CM

## 2022-01-25 DIAGNOSIS — Z0000 Encounter for general adult medical examination without abnormal findings: Secondary | ICD-10-CM | POA: Diagnosis not present

## 2022-01-25 DIAGNOSIS — Z1231 Encounter for screening mammogram for malignant neoplasm of breast: Secondary | ICD-10-CM

## 2022-01-25 DIAGNOSIS — F325 Major depressive disorder, single episode, in full remission: Secondary | ICD-10-CM | POA: Diagnosis not present

## 2022-01-25 DIAGNOSIS — G4733 Obstructive sleep apnea (adult) (pediatric): Secondary | ICD-10-CM

## 2022-01-25 MED ORDER — LEVOTHYROXINE SODIUM 125 MCG PO TABS: ORAL_TABLET | ORAL | 1 refills | Status: DC

## 2022-01-25 MED ORDER — VALSARTAN-HYDROCHLOROTHIAZIDE 160-12.5 MG PO TABS: 1.0000 | ORAL_TABLET | Freq: Every day | ORAL | 1 refills | Status: DC

## 2022-01-26 LAB — COMPLETE METABOLIC PANEL WITH GFR
AG Ratio: 1.2 (calc) (ref 1.0–2.5)
ALT: 44 U/L — ABNORMAL HIGH (ref 6–29)
AST: 45 U/L — ABNORMAL HIGH (ref 10–35)
Albumin: 3.8 g/dL (ref 3.6–5.1)
Alkaline phosphatase (APISO): 118 U/L (ref 37–153)
BUN: 8 mg/dL (ref 7–25)
CO2: 26 mmol/L (ref 20–32)
Calcium: 9.1 mg/dL (ref 8.6–10.4)
Chloride: 106 mmol/L (ref 98–110)
Creat: 0.6 mg/dL (ref 0.50–1.05)
Globulin: 3.1 g/dL (calc) (ref 1.9–3.7)
Glucose, Bld: 86 mg/dL (ref 65–99)
Potassium: 4.3 mmol/L (ref 3.5–5.3)
Sodium: 141 mmol/L (ref 135–146)
Total Bilirubin: 0.2 mg/dL (ref 0.2–1.2)
Total Protein: 6.9 g/dL (ref 6.1–8.1)
eGFR: 101 mL/min/{1.73_m2} (ref >=60)

## 2022-01-26 LAB — LIPID PANEL
Cholesterol: 201 mg/dL — ABNORMAL HIGH (ref <200)
HDL: 46 mg/dL — ABNORMAL LOW (ref >=50)
LDL Cholesterol (Calc): 136 mg/dL (calc) — ABNORMAL HIGH
Non-HDL Cholesterol (Calc): 155 mg/dL (calc) — ABNORMAL HIGH (ref <130)
Total CHOL/HDL Ratio: 4.4 (calc) (ref <5.0)
Triglycerides: 90 mg/dL (ref <150)

## 2022-01-26 LAB — TSH: TSH: 0.08 mIU/L — ABNORMAL LOW (ref 0.40–4.50)

## 2022-01-30 ENCOUNTER — Other Ambulatory Visit: Payer: Self-pay | Admitting: Family Medicine

## 2022-01-30 DIAGNOSIS — E038 Other specified hypothyroidism: Secondary | ICD-10-CM

## 2022-03-14 LAB — TSH: TSH: 0.23 mIU/L — ABNORMAL LOW (ref 0.40–4.50)

## 2022-04-03 ENCOUNTER — Ambulatory Visit
Admission: RE | Admit: 2022-04-03 | Discharge: 2022-04-03 | Disposition: A | Discharge status: Home / Self Care | Payer: 59 | Source: Ambulatory Visit | Attending: Family Medicine | Admitting: Family Medicine

## 2022-04-03 DIAGNOSIS — I2510 Atherosclerotic heart disease of native coronary artery without angina pectoris: Secondary | ICD-10-CM | POA: Insufficient documentation

## 2022-04-03 DIAGNOSIS — Z0000 Encounter for general adult medical examination without abnormal findings: Secondary | ICD-10-CM | POA: Diagnosis present

## 2022-04-03 DIAGNOSIS — Z1231 Encounter for screening mammogram for malignant neoplasm of breast: Secondary | ICD-10-CM | POA: Diagnosis present

## 2022-07-26 NOTE — Progress Notes (Signed)
Name: Shari Long   MRN: [id number]    DOB: 1961-01-31   Date:07/27/2022       Progress Note  Subjective  Chief Complaint  COVID Positive  I connected with  Peggye Fothergill  on 07/27/22 at  7:40 AM EST by a video enabled telemedicine application and verified that I am speaking with the correct person using two identifiers.  I discussed the limitations of evaluation and management by telemedicine and the availability of in person appointments. The patient expressed understanding and agreed to proceed with the virtual visit  Staff also discussed with the patient that there may be a patient responsible charge related to this service. Patient Location: at home  Provider Location: [facility] Additional Individuals present: husband   HPI  COVID-19: symptoms started yesterday, she noticed some nasal congestion and facial pressure, followed by post-nasal drainage , mild fatigue, mild headache,mild sore throat, no fever or chills. No change in taste or smell. Appetite is normal . She is taking Tylenol cold and sinus and Advil prn    Patient Active Problem List   Diagnosis Date Noted   Anti-RNP antibodies present 02/04/2020   Paresthesia and pain of left extremity 02/04/2020   OSA (obstructive sleep apnea) 04/01/2017   Dyslipidemia 10/02/2016   Hypothyroidism due to Hashimoto's thyroiditis 03/29/2016   Major depression in remission ([place]) 03/29/2016   GERD without esophagitis 03/29/2016   Status post vaginal hysterectomy 11/24/2015   Vaginal atrophy 11/24/2015   Essential hypertension 12/25/2010    Past Surgical History:  Procedure Laterality Date   CARDIAC CATHETERIZATION     COLONOSCOPY WITH PROPOFOL N/A 01/26/2020   Procedure: COLONOSCOPY WITH PROPOFOL;  Surgeon: Lucilla Lame, MD;  Location: [facility] ENDOSCOPY;  Service: Endoscopy;  Laterality: N/A;   FOOT SURGERY  1978   left foot    TONSILLECTOMY     VAGINAL HYSTERECTOMY  2002    Family History  Problem Relation Age of Onset    Cancer - Cervical Mother    Diabetes Mother    Thyroid disease Sister    Breast cancer Paternal Uncle 71   Colon cancer Paternal Uncle    Breast cancer Paternal Grandmother 72   Breast cancer Cousin        ovarian (75's), breast(50's) and uterine(60's) cancer on maternal side.    Ovarian cancer Cousin    Heart disease Maternal Grandmother    Heart disease Paternal Grandfather    Breast cancer Other        great pat aunts x 2   Breast cancer Other        great pat uncle    Social History   Socioeconomic History   Marital status: Married    Spouse name: Richardson Landry   Number of children: 2   Years of education: Not on file   Highest education level: Master's degree (e.g., MA, MS, MEng, MEd, MSW, MBA)  Occupational History   Occupation: Hydrographic surveyor: [employer]    Comment: cancer center  Tobacco Use   Smoking status: Never   Smokeless tobacco: Never  Vaping Use   Vaping Use: Never used  Substance and Sexual Activity   Alcohol use: Not Currently    Alcohol/week: 0.0 standard drinks of alcohol    Comment: rare   Drug use: No   Sexual activity: Yes    Partners: Male    Birth control/protection: Surgical  Other Topics Concern   Not on file  Social History Narrative   She was  separated from husband in 2005 but reconciled .    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    Social Determinants of Health   Financial Resource Strain: Low Risk  (01/25/2022)   Overall Financial Resource Strain (CARDIA)    Difficulty of Paying Living Expenses: Not hard at all  Food Insecurity: No Food Insecurity (01/25/2022)   Hunger Vital Sign    Worried About Running Out of Food in the Last Year: Never true    Ran Out of Food in the Last Year: Never true  Transportation Needs: No Transportation Needs (01/25/2022)   PRAPARE - Administrator, Civil Service (Medical): No    Lack of Transportation (Non-Medical): No  Physical Activity: Insufficiently Active  (01/25/2022)   Exercise Vital Sign    Days of Exercise per Week: 2 days    Minutes of Exercise per Session: 20 min  Stress: No Stress Concern Present (01/25/2022)   [employer] of Occupational Health - Occupational Stress Questionnaire    Feeling of Stress : Not at all  Social Connections: Socially Integrated (01/25/2022)   Social Connection and Isolation Panel [NHANES]    Frequency of Communication with Friends and Family: More than three times a week    Frequency of Social Gatherings with Friends and Family: Twice a week    Attends Religious Services: More than 4 times per year    Active Member of [employer] or Organizations: Yes    Attends Banker Meetings: Never    Marital Status: Married  Catering manager Violence: Not At Risk (01/25/2022)   Humiliation, Afraid, Rape, and Kick questionnaire    Fear of Current or Ex-Partner: No    Emotionally Abused: No    Physically Abused: No    Sexually Abused: No     Current Outpatient Medications:    ascorbic acid (VITAMIN C) 500 MG tablet, 500 mg 2 (two) times daily., Disp: , Rfl:    aspirin 81 MG tablet, Take 81 mg by mouth daily., Disp: , Rfl:    calcium citrate-vitamin D 200-200 MG-UNIT TABS, Take 1 tablet by mouth daily., Disp: , Rfl:    L-Lysine 1000 MG TABS, Take by mouth 2 (two) times daily., Disp: , Rfl:    levothyroxine (SYNTHROID) 125 MCG tablet, TAKE 1 TABLET BY MOUTH DAILY BEFORE BREAKFAST. ONLY 1/2 TABLET ON SUNDAY, Disp: 90 tablet, Rfl: 1   Multiple Vitamins tablet, , Disp: , Rfl:    Omega-3 Fatty Acids (RA FISH OIL) 1000 MG CAPS, Take by mouth., Disp: , Rfl:    valsartan-hydrochlorothiazide (DIOVAN-HCT) 160-12.5 MG tablet, TAKE 1 TABLET BY MOUTH DAILY., Disp: 90 tablet, Rfl: 1   vitamin E 400 UNIT capsule, Take 400 Units by mouth daily., Disp: , Rfl:   Allergies  Allergen Reactions   Latex Rash    Gloves when worn over extended period   Penicillins Rash    I personally reviewed active problem list,  medication list, allergies, family history, social history, health maintenance with the patient/caregiver today.   ROS  Ten systems reviewed and is negative except as mentioned in HPI   Objective  Virtual encounter, vitals not obtained.  There is no height or weight on file to calculate BMI.  Physical Exam  Awake, alert and oriented   PHQ2/9:    07/27/2022    7:43 AM 01/25/2022    8:16 AM 07/24/2021    8:15 AM 01/10/2021    9:19 AM 07/12/2020  7:33 AM  Depression screen PHQ 2/9  Decreased Interest 0 0 0 0 0  Down, Depressed, Hopeless 0 0 0 1 0  PHQ - 2 Score 0 0 0 1 0  Altered sleeping 0 1 0 3 1  Tired, decreased energy 3 3 3 1 1   Change in appetite 0 0 0 0 1  Feeling bad or failure about yourself  0 0 0 0 1  Trouble concentrating 0 0 1 0 0  Moving slowly or fidgety/restless 0 0 0 0 0  Suicidal thoughts  0 0 0 0  PHQ-9 Score 3 4 4 5 4   Difficult doing work/chores     Not difficult at all   PHQ-2/9 Result is negative.    Fall Risk:    07/27/2022    7:43 AM 01/25/2022    8:16 AM 07/24/2021    8:15 AM 01/10/2021    9:19 AM 07/12/2020    7:33 AM  Fall Risk   Falls in the past year? 0 0 1 0 1  Number falls in past yr: 0 0 0 0 0  Injury with Fall? 0 0 0 0 1  Risk for fall due to : No Fall Risks No Fall Risks No Fall Risks    Follow up Falls prevention discussed Falls prevention discussed Falls prevention discussed       Assessment & Plan  1. COVID-19  - benzonatate (TESSALON) 100 MG capsule; Take 1-2 capsules (100-200 mg total) by mouth 3 (three) times daily as needed.  Dispense: 40 capsule; Refill: 0  Discussed paxlovid and possible side effects, she will contact me Monday if she decides to take it, currently symptoms are mild and not necessary   2. Hypothyroidism due to Hashimoto's thyroiditis  - TSH - levothyroxine (SYNTHROID) 125 MCG tablet; Take 1 tablet (125 mcg total) by mouth daily before breakfast. Skip Sunday  Dispense: 90 tablet; Refill: 0  3.  Elevated liver enzymes  - Hepatic function panel  4. Essential hypertension  - valsartan-hydrochlorothiazide (DIOVAN-HCT) 160-12.5 MG tablet; TAKE 1 TABLET BY MOUTH DAILY.  Dispense: 90 tablet; Refill: 0   I discussed the assessment and treatment plan with the patient. The patient was provided an opportunity to ask questions and all were answered. The patient agreed with the plan and demonstrated an understanding of the instructions.  The patient was advised to call back or seek an in-person evaluation if the symptoms worsen or if the condition fails to improve as anticipated.  I provided 15  minutes of non-face-to-face time during this encounter.

## 2022-07-27 ENCOUNTER — Encounter: Payer: Self-pay | Admitting: Family Medicine

## 2022-07-27 ENCOUNTER — Telehealth (INDEPENDENT_AMBULATORY_CARE_PROVIDER_SITE_OTHER): Payer: 59 | Admitting: Family Medicine

## 2022-07-27 DIAGNOSIS — E063 Autoimmune thyroiditis: Secondary | ICD-10-CM

## 2022-07-27 DIAGNOSIS — E038 Other specified hypothyroidism: Secondary | ICD-10-CM

## 2022-07-27 DIAGNOSIS — U071 COVID-19: Secondary | ICD-10-CM

## 2022-07-27 DIAGNOSIS — I10 Essential (primary) hypertension: Secondary | ICD-10-CM

## 2022-07-27 DIAGNOSIS — R748 Abnormal levels of other serum enzymes: Secondary | ICD-10-CM

## 2022-07-27 MED ORDER — LEVOTHYROXINE SODIUM 125 MCG PO TABS: 125.0000 ug | ORAL_TABLET | Freq: Every day | ORAL | 0 refills | Status: DC

## 2022-07-27 MED ORDER — VALSARTAN-HYDROCHLOROTHIAZIDE 160-12.5 MG PO TABS: 1.0000 | ORAL_TABLET | Freq: Every day | ORAL | 0 refills | Status: DC

## 2022-07-27 MED ORDER — BENZONATATE 100 MG PO CAPS: 100.0000 mg | ORAL_CAPSULE | Freq: Three times a day (TID) | ORAL | 0 refills | Status: DC | PRN

## 2022-08-09 LAB — HEPATIC FUNCTION PANEL
AG Ratio: 1.5 (calc) (ref 1.0–2.5)
ALT: 18 U/L (ref 6–29)
AST: 26 U/L (ref 10–35)
Albumin: 4.1 g/dL (ref 3.6–5.1)
Alkaline phosphatase (APISO): 110 U/L (ref 37–153)
Bilirubin, Direct: 0 mg/dL (ref 0.0–0.2)
Globulin: 2.7 g/dL (calc) (ref 1.9–3.7)
Indirect Bilirubin: 0.3 mg/dL (calc) (ref 0.2–1.2)
Total Bilirubin: 0.3 mg/dL (ref 0.2–1.2)
Total Protein: 6.8 g/dL (ref 6.1–8.1)

## 2022-08-09 LAB — TSH: TSH: 3.02 mIU/L (ref 0.40–4.50)

## 2022-10-02 ENCOUNTER — Encounter: Payer: Self-pay | Admitting: Family Medicine

## 2022-11-30 ENCOUNTER — Other Ambulatory Visit: Payer: Self-pay | Admitting: Family Medicine

## 2022-11-30 DIAGNOSIS — E038 Other specified hypothyroidism: Secondary | ICD-10-CM

## 2022-12-03 NOTE — Telephone Encounter (Signed)
Requested Prescriptions  Pending Prescriptions Disp Refills   levothyroxine (SYNTHROID) 125 MCG tablet [Pharmacy Med Name: Levothyroxine Sodium 125 MCG Oral Tablet] 90 tablet 0    Sig: TAKE 1 TABLET BY MOUTH ONCE DAILY BEFORE BREAKFAST (SKIP  SUNDAY)     Endocrinology:  Hypothyroid Agents Passed - 11/30/2022  5:29 PM      Passed - TSH in normal range and within 360 days    TSH  Date Value Ref Range Status  08/08/2022 3.02 0.40 - 4.50 mIU/L Final         Passed - Valid encounter within last 12 months    Recent Outpatient Visits           4 months ago COVID-19   [facility] Alba Cory, MD   10 months ago Essential hypertension   [facility] Alba Cory, MD   1 year ago Dyslipidemia   [facility] Alba Cory, MD   1 year ago Essential hypertension   [facility] Alba Cory, MD   2 years ago Major depression in remission [facility])   [facility] Alba Cory, MD       Future Appointments             In 2 months Carlynn Purl, Danna Hefty, MD [facility], [facility]

## 2023-02-01 ENCOUNTER — Encounter: Payer: 59 | Admitting: Family Medicine

## 2023-02-22 ENCOUNTER — Other Ambulatory Visit: Payer: Self-pay | Admitting: Family Medicine

## 2023-02-22 DIAGNOSIS — Z1231 Encounter for screening mammogram for malignant neoplasm of breast: Secondary | ICD-10-CM

## 2023-02-26 ENCOUNTER — Other Ambulatory Visit: Payer: Self-pay | Admitting: Family Medicine

## 2023-02-26 DIAGNOSIS — E038 Other specified hypothyroidism: Secondary | ICD-10-CM

## 2023-02-26 MED ORDER — LEVOTHYROXINE SODIUM 125 MCG PO TABS: 125.0000 ug | ORAL_TABLET | Freq: Every day | ORAL | 1 refills | Status: DC

## 2023-02-26 NOTE — Telephone Encounter (Signed)
Patient states PCP had to [facility] her 02/01/2023 physical appointment to 03/01/2023. Patient states she is completely out and would like medication filled today.    Medication Refill - Medication: levothyroxine (SYNTHROID) 125 MCG tablet   Has the patient contacted their pharmacy? Yes.    (Agent: If yes, when and what did the pharmacy advise?) Contact PCP   Preferred Pharmacy (with phone number or street name):  [facility] [place], [place] - [address] Phone: [phone]  Fax: [phone]      Has the patient been seen for an appointment in the last year OR does the patient have an upcoming appointment? Yes.    Agent: Please be advised that RX refills may take up to 3 business days. We ask that you follow-up with your pharmacy.

## 2023-02-26 NOTE — Telephone Encounter (Signed)
Requested Prescriptions  Pending Prescriptions Disp Refills   levothyroxine (SYNTHROID) 125 MCG tablet 90 tablet 0    Sig: Take 1 tablet (125 mcg total) by mouth daily before breakfast. TAKE 1 TABLET BY MOUTH ONCE DAILY BEFORE BREAKFAST (SKIP  SUNDAY)     Endocrinology:  Hypothyroid Agents Passed - 02/26/2023  1:03 PM      Passed - TSH in normal range and within 360 days    TSH  Date Value Ref Range Status  08/08/2022 3.02 0.40 - 4.50 mIU/L Final         Passed - Valid encounter within last 12 months    Recent Outpatient Visits           7 months ago COVID-19   [facility] Alba Cory, MD   1 year ago Essential hypertension   [facility] Alba Cory, MD   1 year ago Dyslipidemia   [facility] Alba Cory, MD   2 years ago Essential hypertension   [facility] Alba Cory, MD   2 years ago Major depression in remission [facility])   [facility] Alba Cory, MD       Future Appointments             In 3 days Alba Cory, MD [facility]

## 2023-02-28 NOTE — Progress Notes (Signed)
Name: KAHMARI HERARD   MRN: [id number]    DOB: June 20, 1961   Date:03/01/2023       Progress Note  Subjective  Chief Complaint  Annual Exam  HPI  Patient presents for annual CPE and follow up  Dyslipidemia: last LDL improved and we will recheck it today    The 10-year ASCVD risk score (Arnett DK, et al., 2019) is: 5.9%   Values used to calculate the score:     Age: 62 years     Sex: Female     Is Non-Hispanic African American: No     Diabetic: No     Tobacco smoker: No     Systolic Blood Pressure: 120 mmHg     Is BP treated: Yes     HDL Cholesterol: 46 mg/dL     Total Cholesterol: 201 mg/dL    Hypothyroidism: she is taking Synthroid 125 mcg daily but skips Sundays, last TSH at goal and we will recheck today . She denies  change in bowel movement, she used to feel cold all the time but has been feeling hot lately     Obesity. She was doing great on keto diet , weight went down to low 180's lbs, last Nov she was 201 lbs , today is down to 186 lbs. She has been more active - doing pool exercises    HTN: she is taking medication and denies side effects, no chest pain, palpitation.She is currently only taking half pill of valsartan hydrochlorothiazide 160/12.5 mg due to some dizziness and is feeling better. BP is at goal. We will go down on dose to 80/12.5 mg and monitor   Recurrent Falls: she fell three times from October to January, sprained her left ankle on the second time, third time just fell in the middle of her house - did not trip. Discussed PT but she states since she has not fallen in the past 6 months she will let me know if it happens again   Major Depression in Remission:  she states her symptoms are usually worse in the winter she has SAD - she is trying to stay outdoors , she also has an UV light at home.  She took Wellbutrin for a while weaned self off medication a few years ago. She also tried Duloxetine but made her feel dizzy and nauseated. Phq 9 is negative except  for episodes of fatigue.  She states always better during Summer months . Unchanged   OSA: she was unable to tolerate CPAP machine. She is always tired during the day, explained that CPAP can help with fatigue and also memory and mental fogginess. She is not interested in trying again.    Paresthesia: started in 2020, initially both arms, however progressed to mostly on left arm, flank and leg. She was seen by Dr. Sherryll Burger ( neurologist ) had multiple tests done, including MRI C-spine and Brain. Negative for MS. .She states symptoms not as severe or frequent now , mostly  intermittent paresthesia. She saw Rheumatologist but was given reassurance    C-spine MRI 08/2019:   Multilevel degenerative changes as detailed above. There is no high-grade canal stenosis. 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   Diet: she eats a lot of fruit and vegetables  Exercise: continue regular physical activity   Last Eye Exam: she will schedule it  Last Dental Exam: she is due for a visit   Flowsheet Row Office Visit from [facility]/10/[facility] in [facility]  AUDIT-C Score  0      Depression: Phq 9 is  negative    03/01/2023    9:11 AM 07/27/2022    7:43 AM 01/25/2022    8:16 AM 07/24/2021    8:15 AM 01/10/2021    9:19 AM  Depression screen PHQ 2/9  Decreased Interest 0 0 0 0 0  Down, Depressed, Hopeless 0 0 0 0 1  PHQ - 2 Score 0 0 0 0 1  Altered sleeping 0 0 1 0 3  Tired, decreased energy 3 3 3 3 1   Change in appetite 0 0 0 0 0  Feeling bad or failure about yourself  0 0 0 0 0  Trouble concentrating 0 0 0 1 0  Moving slowly or fidgety/restless 0 0 0 0 0  Suicidal thoughts 0  0 0 0  PHQ-9 Score 3 3 4 4 5    Hypertension: BP Readings from Last 3 Encounters:  03/01/23 120/74  01/25/22 126/78  07/24/21 132/82   Obesity: Wt Readings from Last 3 Encounters:  03/01/23 186 lb (84.4 kg)  01/25/22 193 lb (87.5 kg)  07/24/21 194 lb (88 kg)   BMI Readings from Last 3 Encounters:   03/01/23 31.93 kg/m  01/25/22 33.13 kg/m  07/24/21 33.30 kg/m     Vaccines:   RSV: discussed with patient, she refused  Tdap: up to date Shingrix: refuses  Pneumonia: N/A Flu: she stopped getting it since retired  COVID-19: N/A   Hep C Screening: 03/19/16 STD testing and prevention (HIV/chl/gon/syphilis): 08/15/95 Intimate partner violence: negative screen  Sexual History : not sexually active , married, she has some vaginal atrophy, husband has ED  Menstrual History/LMP/Abnormal Bleeding: hysterectomy  Discussed importance of follow up if any post-menopausal bleeding: yes  Incontinence Symptoms: negative for symptoms   Breast cancer:  - Last Mammogram: Scheduled for 04/16/23 - BRCA gene screening: N/A  Osteoporosis Prevention : Discussed high calcium and vitamin D supplementation, weight bearing exercises Bone density: uncle had breast cancer, she had negative BRAC   Cervical cancer screening: N/A  Skin cancer: Discussed monitoring for atypical lesions  Colorectal cancer: 01/26/20   Lung cancer:  Low Dose CT Chest recommended if Age 54-80 years, 20 pack-year currently smoking OR have quit w/in 15years. Patient does not qualify for screen   ECG: 12/25/10 - discussed having it repeated today   Advanced Care Planning: A voluntary discussion about advance care planning including the explanation and discussion of advance directives.  Discussed health care proxy and Living will, and the patient was able to identify a health care proxy as husband.  Patient does not have a living will and power of attorney of health care   Lipids: Lab Results  Component Value Date   CHOL 201 (H) 01/25/2022   CHOL 205 (H) 01/10/2021   CHOL 242 (H) 01/08/2020   Lab Results  Component Value Date   HDL 46 (L) 01/25/2022   HDL 50 01/10/2021   HDL 50 01/08/2020   Lab Results  Component Value Date   LDLCALC 136 (H) 01/25/2022   LDLCALC 134 (H) 01/10/2021   LDLCALC 164 (H) 01/08/2020   Lab  Results  Component Value Date   TRIG 90 01/25/2022   TRIG 100 01/10/2021   TRIG 140 01/08/2020   Lab Results  Component Value Date   CHOLHDL 4.4 01/25/2022   CHOLHDL 4.1 01/10/2021   CHOLHDL 4.8 01/08/2020   No results found for: "LDLDIRECT"  Glucose: Glucose  Date Value Ref Range Status  10/14/2013  83 65 - 99 mg/dL Final   Glucose, Bld  Date Value Ref Range Status  01/25/2022 86 65 - 99 mg/dL Final    Comment:    .            Fasting reference interval .   01/10/2021 91 65 - 99 mg/dL Final    Comment:    .            Fasting reference interval .   01/08/2020 101 (H) 65 - 99 mg/dL Final    Comment:    .            Fasting reference interval . For someone without known diabetes, a glucose value between 100 and 125 mg/dL is consistent with prediabetes and should be confirmed with a follow-up test. .     Patient Active Problem List   Diagnosis Date Noted   Anti-RNP antibodies present 02/04/2020   Paresthesia and pain of left extremity 02/04/2020   OSA (obstructive sleep apnea) 04/01/2017   Dyslipidemia 10/02/2016   Hypothyroidism due to Hashimoto's thyroiditis 03/29/2016   Major depression in remission (HCC) 03/29/2016   GERD without esophagitis 03/29/2016   Status post vaginal hysterectomy 11/24/2015   Vaginal atrophy 11/24/2015   Essential hypertension 12/25/2010    Past Surgical History:  Procedure Laterality Date   CARDIAC CATHETERIZATION     COLONOSCOPY WITH PROPOFOL N/A 01/26/2020   Procedure: COLONOSCOPY WITH PROPOFOL;  Surgeon: Midge Minium, MD;  Location: [facility] ENDOSCOPY;  Service: Endoscopy;  Laterality: N/A;   FOOT SURGERY  1978   left foot    TONSILLECTOMY     VAGINAL HYSTERECTOMY  2002    Family History  Problem Relation Age of Onset   Cancer - Cervical Mother    Diabetes Mother    Thyroid disease Sister    Breast cancer Paternal Uncle 50   Colon cancer Paternal Uncle    Breast cancer Paternal Grandmother 17   Breast cancer Cousin         ovarian (50's), breast(50's) and uterine(60's) cancer on maternal side.    Ovarian cancer Cousin    Heart disease Maternal Grandmother    Heart disease Paternal Grandfather    Breast cancer Other        great pat aunts x 2   Breast cancer Other        great pat uncle    Social History   Socioeconomic History   Marital status: Married    Spouse name: Brett Canales   Number of children: 2   Years of education: Not on file   Highest education level: Master's degree (e.g., MA, MS, MEng, MEd, MSW, MBA)  Occupational History   Occupation: Producer, television/film/video: [employer]    Comment: cancer center  Tobacco Use   Smoking status: Never   Smokeless tobacco: Never  Vaping Use   Vaping status: Never Used  Substance and Sexual Activity   Alcohol use: Not Currently    Alcohol/week: 0.0 standard drinks of alcohol    Comment: rare   Drug use: No   Sexual activity: Yes    Partners: Male    Birth control/protection: Surgical  Other Topics Concern   Not on file  Social History Narrative   She was separated from husband in 2005 but reconciled .    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    Social Determinants of Health   Financial Resource Strain: Low Risk  (03/01/2023)  Overall Financial Resource Strain (CARDIA)    Difficulty of Paying Living Expenses: Not hard at all  Food Insecurity: No Food Insecurity (03/01/2023)   Hunger Vital Sign    Worried About Running Out of Food in the Last Year: Never true    Ran Out of Food in the Last Year: Never true  Transportation Needs: No Transportation Needs (03/01/2023)   PRAPARE - Administrator, Civil Service (Medical): No    Lack of Transportation (Non-Medical): No  Physical Activity: Sufficiently Active (03/01/2023)   Exercise Vital Sign    Days of Exercise per Week: 5 days    Minutes of Exercise per Session: 50 min  Stress: No Stress Concern Present (03/01/2023)   [employer] of Occupational  Health - Occupational Stress Questionnaire    Feeling of Stress : Not at all  Social Connections: Socially Integrated (03/01/2023)   Social Connection and Isolation Panel [NHANES]    Frequency of Communication with Friends and Family: More than three times a week    Frequency of Social Gatherings with Friends and Family: Once a week    Attends Religious Services: More than 4 times per year    Active Member of [employer] or Organizations: Yes    Attends Engineer, structural: More than 4 times per year    Marital Status: Married  Catering manager Violence: Not At Risk (03/01/2023)   Humiliation, Afraid, Rape, and Kick questionnaire    Fear of Current or Ex-Partner: No    Emotionally Abused: No    Physically Abused: No    Sexually Abused: No     Current Outpatient Medications:    ascorbic acid (VITAMIN C) 500 MG tablet, 500 mg 2 (two) times daily., Disp: , Rfl:    aspirin 81 MG tablet, Take 81 mg by mouth daily., Disp: , Rfl:    calcium citrate-vitamin D 200-200 MG-UNIT TABS, Take 1 tablet by mouth daily., Disp: , Rfl:    L-Lysine 1000 MG TABS, Take by mouth 2 (two) times daily., Disp: , Rfl:    levothyroxine (SYNTHROID) 125 MCG tablet, Take 1 tablet (125 mcg total) by mouth daily before breakfast. TAKE 1 TABLET BY MOUTH ONCE DAILY BEFORE BREAKFAST (SKIP  SUNDAY), Disp: 90 tablet, Rfl: 1   Multiple Vitamins tablet, , Disp: , Rfl:    Omega-3 Fatty Acids (RA FISH OIL) 1000 MG CAPS, Take by mouth., Disp: , Rfl:    valsartan-hydrochlorothiazide (DIOVAN-HCT) 80-12.5 MG tablet, Take 1 tablet by mouth daily., Disp: 90 tablet, Rfl: 1   vitamin E 400 UNIT capsule, Take 400 Units by mouth daily., Disp: , Rfl:   Allergies  Allergen Reactions   Latex Rash    Gloves when worn over extended period   Penicillins Rash     ROS  Constitutional: Negative for fever or weight change.  Respiratory: Negative for cough and shortness of breath.   Cardiovascular: Negative for chest pain , occasional  palpitations.  Gastrointestinal: Negative for abdominal pain, no bowel changes.  Musculoskeletal: Negative for gait problem or joint swelling.  Skin: Negative for rash.  Neurological: occasional  dizziness but no headache.  No other specific complaints in a complete review of systems (except as listed in HPI above).   Objective  Vitals:   03/01/23 0912  BP: 120/74  Pulse: 80  Resp: 16  SpO2: 98%  Weight: 186 lb (84.4 kg)  Height: 5\' 4"  (1.626 m)    Body mass index is 31.93 kg/m.  Physical Exam  Constitutional:  Patient appears well-developed and well-nourished. No distress.  HENT: Head: Normocephalic and atraumatic. Ears: B TMs ok, no erythema or effusion; Nose: Nose normal. Mouth/Throat: Oropharynx is clear and moist. No oropharyngeal exudate.  Eyes: Conjunctivae and EOM are normal. Pupils are equal, round, and reactive to light. No scleral icterus.  Neck: Normal range of motion. Neck supple. No JVD present. No thyromegaly present.  Cardiovascular: Normal rate, regular rhythm and normal heart sounds.  No murmur heard. No BLE edema. Pulmonary/Chest: Effort normal and breath sounds normal. No respiratory distress. Abdominal: Soft. Bowel sounds are normal, no distension. There is no tenderness. no masses Breast: no lumps or masses, no nipple discharge or rashes FEMALE GENITALIA:  No done  RECTAL: not done  Musculoskeletal: Normal range of motion, mild puffiness on left lateral ankle  No gross deformities Neurological: he is alert and oriented to person, place, and time. No cranial nerve deficit. Coordination, balance, strength, speech and gait are normal.  Skin: Skin is warm and dry. No rash noted. No erythema.  Psychiatric: Patient has a normal mood and affect. behavior is normal. Judgment and thought content normal.   Fall Risk:    03/01/2023    9:11 AM 07/27/2022    7:43 AM 01/25/2022    8:16 AM 07/24/2021    8:15 AM 01/10/2021    9:19 AM  Fall Risk   Falls in the past  year? 1 0 0 1 0  Number falls in past yr: 1 0 0 0 0  Injury with Fall? 1 0 0 0 0  Risk for fall due to :  No Fall Risks No Fall Risks No Fall Risks   Follow up Falls prevention discussed Falls prevention discussed Falls prevention discussed Falls prevention discussed      Functional Status Survey: Is the patient deaf or have difficulty hearing?: No Does the patient have difficulty seeing, even when wearing glasses/contacts?: No Does the patient have difficulty concentrating, remembering, or making decisions?: No Does the patient have difficulty walking or climbing stairs?: No Does the patient have difficulty dressing or bathing?: No Does the patient have difficulty doing errands alone such as visiting a doctor's office or shopping?: No   Assessment & Plan  1. Well adult exam  - Lipid panel - CBC with Differential/Platelet - COMPLETE METABOLIC PANEL WITH GFR - TSH - valsartan-hydrochlorothiazide (DIOVAN-HCT) 80-12.5 MG tablet; Take 1 tablet by mouth daily.  Dispense: 90 tablet; Refill: 1 - EKG 12-Lead  2. Hypothyroidism due to Hashimoto's thyroiditis  - TSH  3. Dyslipidemia  - Lipid panel  4. Essential hypertension  - CBC with Differential/Platelet - COMPLETE METABOLIC PANEL WITH GFR - valsartan-hydrochlorothiazide (DIOVAN-HCT) 80-12.5 MG tablet; Take 1 tablet by mouth daily.  Dispense: 90 tablet; Refill: 1 - EKG 12-Lead  5. OSA (obstructive sleep apnea)  - CBC with Differential/Platelet  6. Breast cancer screening by mammogram  Scheduled   7. Recurrent falls  She will call me when she is ready for PT    8. Abnormal electrocardiogram (ECG) (EKG)  - Ambulatory referral to Cardiology   -USPSTF grade A and B recommendations reviewed with patient; age-appropriate recommendations, preventive care, screening tests, etc discussed and encouraged; healthy living encouraged; see AVS for patient education given to patient -Discussed importance of 150 minutes of  physical activity weekly, eat two servings of fish weekly, eat one serving of tree nuts ( cashews, pistachios, pecans, almonds.Marland Kitchen) every other day, eat 6 servings of fruit/vegetables daily and drink plenty of water and avoid sweet  beverages.   -Reviewed Health Maintenance: Yes.

## 2023-03-01 ENCOUNTER — Encounter: Payer: Self-pay | Admitting: Family Medicine

## 2023-03-01 ENCOUNTER — Ambulatory Visit (INDEPENDENT_AMBULATORY_CARE_PROVIDER_SITE_OTHER): Payer: Commercial Managed Care - HMO | Admitting: Family Medicine

## 2023-03-01 VITALS — BP 120/74 | HR 80 | Resp 16 | Ht 64.0 in | Wt 186.0 lb

## 2023-03-01 DIAGNOSIS — E038 Other specified hypothyroidism: Secondary | ICD-10-CM | POA: Diagnosis not present

## 2023-03-01 DIAGNOSIS — G4733 Obstructive sleep apnea (adult) (pediatric): Secondary | ICD-10-CM

## 2023-03-01 DIAGNOSIS — E785 Hyperlipidemia, unspecified: Secondary | ICD-10-CM

## 2023-03-01 DIAGNOSIS — I10 Essential (primary) hypertension: Secondary | ICD-10-CM

## 2023-03-01 DIAGNOSIS — Z0000 Encounter for general adult medical examination without abnormal findings: Secondary | ICD-10-CM | POA: Diagnosis not present

## 2023-03-01 DIAGNOSIS — Z1231 Encounter for screening mammogram for malignant neoplasm of breast: Secondary | ICD-10-CM

## 2023-03-01 DIAGNOSIS — R9431 Abnormal electrocardiogram [ECG] [EKG]: Secondary | ICD-10-CM

## 2023-03-01 DIAGNOSIS — E063 Autoimmune thyroiditis: Secondary | ICD-10-CM

## 2023-03-01 DIAGNOSIS — R296 Repeated falls: Secondary | ICD-10-CM

## 2023-03-01 MED ORDER — VALSARTAN-HYDROCHLOROTHIAZIDE 80-12.5 MG PO TABS: 1.0000 | ORAL_TABLET | Freq: Every day | ORAL | 1 refills | Status: DC

## 2023-03-02 LAB — CBC WITH DIFFERENTIAL/PLATELET
Absolute Monocytes: 410 cells/uL (ref 200–950)
Basophils Absolute: 59 cells/uL (ref 0–200)
Basophils Relative: 1.1 %
Eosinophils Absolute: 178 cells/uL (ref 15–500)
Eosinophils Relative: 3.3 %
HCT: 40.1 % (ref 35.0–45.0)
Hemoglobin: 12.9 g/dL (ref 11.7–15.5)
Lymphs Abs: 1976 cells/uL (ref 850–3900)
MCH: 27.7 pg (ref 27.0–33.0)
MCHC: 32.2 g/dL (ref 32.0–36.0)
MCV: 86.1 fL (ref 80.0–100.0)
MPV: 9.3 fL (ref 7.5–12.5)
Monocytes Relative: 7.6 %
Neutro Abs: 2776 cells/uL (ref 1500–7800)
Neutrophils Relative %: 51.4 %
Platelets: 273 10*3/uL (ref 140–400)
RBC: 4.66 10*6/uL (ref 3.80–5.10)
RDW: 14 % (ref 11.0–15.0)
Total Lymphocyte: 36.6 %
WBC: 5.4 10*3/uL (ref 3.8–10.8)

## 2023-03-02 LAB — COMPLETE METABOLIC PANEL WITH GFR
AG Ratio: 1.6 (calc) (ref 1.0–2.5)
ALT: 15 U/L (ref 6–29)
AST: 28 U/L (ref 10–35)
Albumin: 4.1 g/dL (ref 3.6–5.1)
Alkaline phosphatase (APISO): 97 U/L (ref 37–153)
BUN/Creatinine Ratio: 7 (calc) (ref 6–22)
BUN: 5 mg/dL — ABNORMAL LOW (ref 7–25)
CO2: 28 mmol/L (ref 20–32)
Calcium: 9.4 mg/dL (ref 8.6–10.4)
Chloride: 105 mmol/L (ref 98–110)
Creat: 0.68 mg/dL (ref 0.50–1.05)
Globulin: 2.6 g/dL (calc) (ref 1.9–3.7)
Glucose, Bld: 88 mg/dL (ref 65–99)
Potassium: 4.2 mmol/L (ref 3.5–5.3)
Sodium: 141 mmol/L (ref 135–146)
Total Bilirubin: 0.4 mg/dL (ref 0.2–1.2)
Total Protein: 6.7 g/dL (ref 6.1–8.1)
eGFR: 98 mL/min/{1.73_m2} (ref >=60)

## 2023-03-02 LAB — LIPID PANEL
Cholesterol: 212 mg/dL — ABNORMAL HIGH (ref <200)
HDL: 44 mg/dL — ABNORMAL LOW (ref >=50)
LDL Cholesterol (Calc): 143 mg/dL (calc) — ABNORMAL HIGH
Non-HDL Cholesterol (Calc): 168 mg/dL (calc) — ABNORMAL HIGH (ref <130)
Total CHOL/HDL Ratio: 4.8 (calc) (ref <5.0)
Triglycerides: 123 mg/dL (ref <150)

## 2023-03-02 LAB — TSH: TSH: 0.13 mIU/L — ABNORMAL LOW (ref 0.40–4.50)

## 2023-03-04 ENCOUNTER — Other Ambulatory Visit: Payer: Self-pay | Admitting: Family Medicine

## 2023-03-04 DIAGNOSIS — E038 Other specified hypothyroidism: Secondary | ICD-10-CM

## 2023-03-04 MED ORDER — LEVOTHYROXINE SODIUM 100 MCG PO TABS: 100.0000 ug | ORAL_TABLET | Freq: Every day | ORAL | 0 refills | Status: DC

## 2023-04-16 ENCOUNTER — Ambulatory Visit
Admission: RE | Admit: 2023-04-16 | Discharge: 2023-04-16 | Disposition: A | Discharge status: Home / Self Care | Payer: Commercial Managed Care - HMO | Source: Ambulatory Visit | Attending: Family Medicine | Admitting: Family Medicine

## 2023-04-16 DIAGNOSIS — Z1231 Encounter for screening mammogram for malignant neoplasm of breast: Secondary | ICD-10-CM | POA: Diagnosis present

## 2023-05-13 DIAGNOSIS — R9431 Abnormal electrocardiogram [ECG] [EKG]: Secondary | ICD-10-CM | POA: Insufficient documentation

## 2023-05-13 NOTE — Progress Notes (Unsigned)
Cardiology Office Note  Date:  05/13/2023   ID:  Shari Long, DOB 1960-11-22, MRN [id number]  PCP:  Alba Cory, MD   No chief complaint on file.   HPI:  Shari Long is a very pleasant 62 year old woman with a history of  palpitations,  hypertension, Hyperlipidemia Obstructive sleep apnea  shortness of breath with previous workup including a Myoview leading to a cardiac catheterization that was 10 years ago that required report showed 20% left main disease, ejection fraction 66%,  who presents [facility]  for evaluation of abnormal EKG    EKG March 01, 2023 Showing diffuse T wave abnormality precordial leads, inferior leads    PMH:   has a past medical history of DDD (degenerative disc disease), lumbar, Degenerative disc disease, Family history of adverse reaction to anesthesia, Fatigue, Hypertension, Increased BMI, Menopause, Neurotrophic keratitis of left eye, Numbness of foot, Seasonal allergies, Thyroid disease, and Vaginal atrophy.  PSH:    Past Surgical History:  Procedure Laterality Date   CARDIAC CATHETERIZATION     COLONOSCOPY WITH PROPOFOL N/A 01/26/2020   Procedure: COLONOSCOPY WITH PROPOFOL;  Surgeon: Midge Minium, MD;  Location: [facility] ENDOSCOPY;  Service: Endoscopy;  Laterality: N/A;   FOOT SURGERY  1978   left foot    TONSILLECTOMY     VAGINAL HYSTERECTOMY  2002    Current Outpatient Medications  Medication Sig Dispense Refill   ascorbic acid (VITAMIN C) 500 MG tablet 500 mg 2 (two) times daily.     aspirin 81 MG tablet Take 81 mg by mouth daily.     calcium citrate-vitamin D 200-200 MG-UNIT TABS Take 1 tablet by mouth daily.     L-Lysine 1000 MG TABS Take by mouth 2 (two) times daily.     levothyroxine (SYNTHROID) 100 MCG tablet Take 1 tablet (100 mcg total) by mouth daily. 90 tablet 0   Multiple Vitamins tablet      Omega-3 Fatty Acids (RA FISH OIL) 1000 MG CAPS Take by mouth.     valsartan-hydrochlorothiazide (DIOVAN-HCT) 80-12.5 MG tablet Take 1  tablet by mouth daily. 90 tablet 1   vitamin E 400 UNIT capsule Take 400 Units by mouth daily.     No current facility-administered medications for this visit.     Allergies:   Latex and Penicillins   Social History:  The patient  reports that she has never smoked. She has never used smokeless tobacco. She reports that she does not currently use alcohol. She reports that she does not use drugs.   Family History:   family history includes Breast cancer in her cousin and other family members; Breast cancer (age of onset: 28) in her paternal grandmother; Breast cancer (age of onset: 81) in her paternal uncle; Cancer - Cervical in her mother; Colon cancer in her paternal uncle; Diabetes in her mother; Heart disease in her maternal grandmother and paternal grandfather; Ovarian cancer in her cousin; Thyroid disease in her sister.    Review of Systems: ROS   PHYSICAL EXAM: VS:  There were no vitals taken for this visit. , BMI There is no height or weight on file to calculate BMI. GEN: Well nourished, well developed, in no acute distress HEENT: normal Neck: no JVD, carotid bruits, or masses Cardiac: RRR; no murmurs, rubs, or gallops,no edema  Respiratory:  clear to auscultation bilaterally, normal work of breathing GI: soft, nontender, nondistended, + BS MS: no deformity or atrophy Skin: warm and dry, no rash Neuro:  Strength and sensation are intact Psych:  euthymic mood, full affect    Recent Labs: 03/01/2023: ALT 15; BUN 5; Creat 0.68; Hemoglobin 12.9; Platelets 273; Potassium 4.2; Sodium 141; TSH 0.13    Lipid Panel Lab Results  Component Value Date   CHOL 212 (H) 03/01/2023   HDL 44 (L) 03/01/2023   LDLCALC 143 (H) 03/01/2023   TRIG 123 03/01/2023      Wt Readings from Last 3 Encounters:  03/01/23 186 lb (84.4 kg)  01/25/22 193 lb (87.5 kg)  07/24/21 194 lb (88 kg)       ASSESSMENT AND PLAN:  Problem List Items Addressed This Visit   None    Disposition:   F/U   12 months   Total encounter time more than 30 minutes  Greater than 50% was spent in counseling and coordination of care with the patient    Signed, Dossie Arbour, M.D., Ph.D. [facility] [place], [place] [phone]

## 2023-05-14 ENCOUNTER — Ambulatory Visit: Payer: Managed Care, Other (non HMO) | Attending: Cardiovascular Disease | Admitting: Cardiovascular Disease

## 2023-05-14 ENCOUNTER — Encounter: Payer: Self-pay | Admitting: Cardiovascular Disease

## 2023-05-14 VITALS — BP 150/90 | HR 61 | Ht 63.5 in | Wt 194.0 lb

## 2023-05-14 DIAGNOSIS — E785 Hyperlipidemia, unspecified: Secondary | ICD-10-CM | POA: Diagnosis not present

## 2023-05-14 DIAGNOSIS — I10 Essential (primary) hypertension: Secondary | ICD-10-CM | POA: Diagnosis not present

## 2023-05-14 DIAGNOSIS — R072 Precordial pain: Secondary | ICD-10-CM

## 2023-05-14 DIAGNOSIS — I25118 Atherosclerotic heart disease of native coronary artery with other forms of angina pectoris: Secondary | ICD-10-CM

## 2023-05-14 DIAGNOSIS — R9431 Abnormal electrocardiogram [ECG] [EKG]: Secondary | ICD-10-CM

## 2023-05-14 DIAGNOSIS — I209 Angina pectoris, unspecified: Secondary | ICD-10-CM

## 2023-05-14 DIAGNOSIS — G4733 Obstructive sleep apnea (adult) (pediatric): Secondary | ICD-10-CM | POA: Diagnosis not present

## 2023-05-14 MED ORDER — METOPROLOL TARTRATE 50 MG PO TABS: 50.0000 mg | ORAL_TABLET | Freq: Once | ORAL | 0 refills | Status: DC

## 2023-05-14 NOTE — Patient Instructions (Addendum)
Medication Instructions:  Take Metoprolol Tartrate 50 MG once 2 hours prior to Cardiac CT  If you need a refill on your cardiac medications before your next appointment, please call your pharmacy.   Lab work: Your provider would like for you to have following labs drawn today BMP.    Testing/Procedures:   Your cardiac CT will be scheduled at one of the below locations:    [facility] [address] [place], [place] [address] [phone]  OR   [facility] [address] [place], [place] [address] [phone]    If scheduled at [facility] or [facility], please arrive 15 mins early for check-in and test prep.  There is spacious parking and easy access to the radiology department from the [facility] entrance. Please enter here and check-in with the desk attendant.   Please follow these instructions carefully (unless otherwise directed):  An IV will be required for this test and Nitroglycerin will be given.   On the Night Before the Test: Be sure to Drink plenty of water. Do not consume any caffeinated/decaffeinated beverages or chocolate 12 hours prior to your test. Do not take any antihistamines 12 hours prior to your test.  On the Day of the Test: Drink plenty of water until 1 hour prior to the test. Do not eat any food 1 hour prior to test. You may take your regular medications prior to the test except Diovan. Take metoprolol (Lopressor) two hours prior to test. If you take Furosemide/Hydrochlorothiazide/Spironolactone, please HOLD on the morning of the test. FEMALES- please wear underwire-free bra if available, avoid dresses & tight clothing       After the Test: Drink plenty of water. After receiving IV contrast, you may experience a mild flushed feeling. This is normal. On occasion, you may experience a mild rash up to 24 hours  after the test. This is not dangerous. If this occurs, you can take Benadryl 25 mg and increase your fluid intake. If you experience trouble breathing, this can be serious. If it is severe call 911 IMMEDIATELY. If it is mild, please call our office. If you take any of these medications: Glipizide/Metformin, Avandament, Glucavance, please do not take 48 hours after completing test unless otherwise instructed.  We will call to schedule your test 2-4 weeks out understanding that some insurance companies will need an authorization prior to the service being performed.   For more information and frequently asked questions, please visit our website : [email or web address]  For non-scheduling related questions, please contact the cardiac imaging nurse navigator should you have any questions/concerns: Cardiac Imaging Nurse Navigators Direct Office Dial: [phone]   For scheduling needs, including cancellations and rescheduling, please call [place], [phone].   Follow-Up: At [facility], you and your health needs are our priority.  As part of our continuing mission to provide you with exceptional heart care, we have created designated Provider Care Teams.  These Care Teams include your primary Cardiologist (physician) and Advanced Practice Providers (APPs -  Physician Assistants and Nurse Practitioners) who all work together to provide you with the care you need, when you need it.  You will need a follow up appointment as needed  Providers on your designated Care Team:   Nicolasa Ducking, NP Eula Listen, PA-C Cadence Fransico Michael, [place]  COVID-19 Vaccine Information can be found at: [email or web address] For questions related to vaccine distribution or appointments, please email [email or web address] or call  [phone].   

## 2023-05-15 LAB — BASIC METABOLIC PANEL
BUN/Creatinine Ratio: 11 — ABNORMAL LOW (ref 12–28)
BUN: 9 mg/dL (ref 8–27)
CO2: 25 mmol/L (ref 20–29)
Calcium: 10 mg/dL (ref 8.7–10.3)
Chloride: 102 mmol/L (ref 96–106)
Creatinine, Ser: 0.81 mg/dL (ref 0.57–1.00)
Glucose: 87 mg/dL (ref 70–99)
Potassium: 4.4 mmol/L (ref 3.5–5.2)
Sodium: 140 mmol/L (ref 134–144)
eGFR: 82 mL/min/{1.73_m2} (ref >59)

## 2023-05-17 ENCOUNTER — Ambulatory Visit: Payer: Commercial Managed Care - HMO | Admitting: Cardiovascular Disease

## 2023-05-21 ENCOUNTER — Telehealth (HOSPITAL_COMMUNITY): Payer: Self-pay | Admitting: *Deleted

## 2023-05-21 NOTE — Telephone Encounter (Signed)
Reaching out to patient to offer assistance regarding upcoming cardiac imaging study; pt verbalizes understanding of appt date/time, parking situation and where to check in, pre-test NPO status and medications ordered, and verified current allergies; name and call back number provided for further questions should they arise Hayley Sharpe RN Navigator Cardiac Imaging [facility] [phone] office [phone] cell  

## 2023-05-22 ENCOUNTER — Ambulatory Visit
Admission: RE | Admit: 2023-05-22 | Discharge: 2023-05-22 | Disposition: A | Discharge status: Home / Self Care | Payer: Commercial Managed Care - HMO | Source: Ambulatory Visit | Attending: Cardiovascular Disease | Admitting: Cardiovascular Disease

## 2023-05-22 DIAGNOSIS — R072 Precordial pain: Secondary | ICD-10-CM | POA: Diagnosis present

## 2023-05-22 DIAGNOSIS — I209 Angina pectoris, unspecified: Secondary | ICD-10-CM | POA: Diagnosis present

## 2023-05-22 DIAGNOSIS — I25118 Atherosclerotic heart disease of native coronary artery with other forms of angina pectoris: Secondary | ICD-10-CM

## 2023-05-22 MED ORDER — NITROGLYCERIN 0.4 MG SL SUBL
0.8000 mg | SUBLINGUAL_TABLET | Freq: Once | SUBLINGUAL | Status: AC
  Administered 2023-05-22: 0.8 mg via SUBLINGUAL
  Filled 2023-05-22: qty 25

## 2023-05-22 MED ORDER — IOHEXOL 350 MG/ML SOLN
80.0000 mL | Freq: Once | INTRAVENOUS | Status: AC | PRN
  Administered 2023-05-22: 80 mL via INTRAVENOUS

## 2023-05-22 MED ORDER — SODIUM CHLORIDE (PF) 0.9% IJ SOLUTION - NO CHARGE
100.0000 mL | INTRAMUSCULAR | Status: DC | PRN
  Administered 2023-05-22: 100 mL via INTRAVENOUS
  Filled 2023-05-22: qty 100

## 2023-05-22 NOTE — Progress Notes (Signed)
Pt tolerated procedure well with no issues. Pt ABCs intact. Pt denies any complaints. Pt encouraged to drink plenty of water throughout the day. Pt ambulatory with steady gait.

## 2023-06-11 ENCOUNTER — Other Ambulatory Visit: Payer: Self-pay | Admitting: Family Medicine

## 2023-06-12 ENCOUNTER — Other Ambulatory Visit: Payer: Self-pay | Admitting: Family Medicine

## 2023-06-12 LAB — TSH: TSH: 2.29 m[IU]/L (ref 0.40–4.50)

## 2023-06-12 MED ORDER — LEVOTHYROXINE SODIUM 100 MCG PO TABS
100.0000 ug | ORAL_TABLET | Freq: Every day | ORAL | 1 refills | Status: DC
Start: 2023-06-12 — End: 2023-12-09

## 2023-07-22 NOTE — Progress Notes (Signed)
Name: Shari Long   MRN: [id number]    DOB: Apr 26, 1961   Date:08/02/2023       Progress Note  Subjective  Chief Complaint  Chief Complaint  Patient presents with   Medical Management of Chronic Issues    HPI  Discussed the use of AI scribe software for clinical note transcription with the patient, who gave verbal consent to proceed.  History of Present Illness   The patient, a retired Engineer, civil (consulting) with a history of Hashimoto's thyroiditis, hypertension, and obstructive sleep apnea, presented for a follow-up visit. She reported experiencing palpitations, which had previously led to an abnormal EKG and a referral to a cardiologist. In October, the patient underwent a cardiac calcium score test, which showed no plaque in the heart vessels but revealed some plaque formation on the aorta. The patient denied experiencing chest pain.  The patient's cholesterol levels have been slightly above the goal, with the most recent LDL level at 143 in July. She declined to start cholesterol medication, expressing a desire to manage her cholesterol through diet and exercise. She acknowledged a hereditary factor in her cholesterol levels.  For hypertension, the patient has been taking Valsartan HCTZ. However, she had reduced her dose to half of the prescribed 80/12.5 mg, hoping to eventually discontinue the medication. Recent blood pressure readings have been slightly elevated, prompting the patient to decide to return to the full prescribed dose.  The patient's thyroid levels were reported to be within the normal range on her current dose of 100 micrograms of levothyroxine daily. She reported no significant changes in symptoms related to her thyroid condition, such as dry skin or hair loss.  The patient also reported seasonal affective disorder, which she manages with a UV light and by spending time outdoors. She noted some fatigue, which she attributed to her thyroid condition rather than her sleep apnea. She  does not use a CPAP machine for her sleep apnea, as she cannot tolerate it.  The patient also reported a history of major depression, which is currently in remission. She had previously tried Wellbutrin and duloxetine but could not tolerate them. She manages her mood with physical activity and spending time outdoors, particularly with her grandchildren.  The patient also reported a history of gastroesophageal reflux disease (GERD), which she manages through dietary choices, avoiding red meat in particular. She recently experienced indigestion after consuming a hamburger, which reinforced her commitment to dietary management of her GERD.          The 10-year ASCVD risk score (Arnett DK, et al., 2019) is: 9.4%   Values used to calculate the score:     Age: 62 years     Sex: Female     Is Non-Hispanic African American: No     Diabetic: No     Tobacco smoker: No     Systolic Blood Pressure: 142 mmHg     Is BP treated: Yes     HDL Cholesterol: 44 mg/dL     Total Cholesterol: 212 mg/dL    Patient Active Problem List   Diagnosis Date Noted   Atherosclerosis of aorta (HCC) 08/02/2023   Abnormal EKG 05/13/2023   Anti-RNP antibodies present 02/04/2020   Paresthesia and pain of left extremity 02/04/2020   OSA (obstructive sleep apnea) 04/01/2017   Dyslipidemia 10/02/2016   Hypothyroidism due to Hashimoto's thyroiditis 03/29/2016   Major depression in remission (HCC) 03/29/2016   GERD without esophagitis 03/29/2016   Status post vaginal hysterectomy 11/24/2015   Vaginal atrophy  11/24/2015   Essential hypertension 12/25/2010    Past Surgical History:  Procedure Laterality Date   CARDIAC CATHETERIZATION     COLONOSCOPY WITH PROPOFOL N/A 01/26/2020   Procedure: COLONOSCOPY WITH PROPOFOL;  Surgeon: Midge Minium, MD;  Location: [facility] ENDOSCOPY;  Service: Endoscopy;  Laterality: N/A;   FOOT SURGERY  1978   left foot    TONSILLECTOMY     VAGINAL HYSTERECTOMY  2002    Family History   Problem Relation Age of Onset   Arrhythmia Mother    Cancer - Cervical Mother    Diabetes Mother    Atrial fibrillation Mother    Thyroid disease Sister    Breast cancer Paternal Uncle 72   Colon cancer Paternal Uncle    Heart disease Maternal Grandmother    Breast cancer Paternal Grandmother 21   Heart disease Paternal Grandfather    Breast cancer Cousin        ovarian (50's), breast(50's) and uterine(60's) cancer on maternal side.    Ovarian cancer Cousin    Breast cancer Other        great pat aunts x 2   Breast cancer Other        great pat uncle    Social History   Tobacco Use   Smoking status: Never   Smokeless tobacco: Never  Substance Use Topics   Alcohol use: Not Currently    Alcohol/week: 0.0 standard drinks of alcohol    Comment: rare     Current Outpatient Medications:    aspirin 81 MG tablet, Take 81 mg by mouth daily., Disp: , Rfl:    b complex vitamins capsule, Take 1 capsule by mouth daily., Disp: , Rfl:    calcium citrate-vitamin D 200-200 MG-UNIT TABS, Take 1 tablet by mouth daily., Disp: , Rfl:    L-Lysine 1000 MG TABS, Take by mouth 2 (two) times daily., Disp: , Rfl:    levothyroxine (SYNTHROID) 100 MCG tablet, Take 1 tablet (100 mcg total) by mouth daily., Disp: 90 tablet, Rfl: 1   Multiple Vitamins tablet, , Disp: , Rfl:    Omega-3 Fatty Acids (RA FISH OIL) 1000 MG CAPS, Take by mouth., Disp: , Rfl:    valsartan-hydrochlorothiazide (DIOVAN-HCT) 80-12.5 MG tablet, Take 1 tablet by mouth daily., Disp: 90 tablet, Rfl: 1   vitamin E 400 UNIT capsule, Take 400 Units by mouth daily., Disp: , Rfl:   Allergies  Allergen Reactions   Latex Rash    Gloves when worn over extended period   Penicillins Rash    I personally reviewed active problem list, medication list, allergies, family history with the patient/caregiver today.   ROS  Ten systems reviewed and is negative except as mentioned in HPI    Objective  Vitals:   08/02/23 0839  BP: (!)  142/84  Pulse: 77  Resp: 16  Temp: 98 F (36.7 C)  SpO2: 96%     Body mass index is 34.51 kg/m.  Physical Exam  Constitutional: Patient appears well-developed and well-nourished. Obese  No distress.  HEENT: head atraumatic, normocephalic, pupils equal and reactive to light, neck supple Cardiovascular: Normal rate, regular rhythm and normal heart sounds.  No murmur heard. No BLE edema. Pulmonary/Chest: Effort normal and breath sounds normal. No respiratory distress. Abdominal: Soft.  There is no tenderness. Psychiatric: Patient has a normal mood and affect. behavior is normal. Judgment and thought content normal.   Recent Results (from the past 2160 hours)  Basic metabolic panel     Status: Abnormal  Collection Time: 05/14/23 12:12 PM  Result Value Ref Range   Glucose 87 70 - 99 mg/dL   BUN 9 8 - 27 mg/dL   Creatinine, Ser 1.32 0.57 - 1.00 mg/dL   eGFR 82 >44 WN/UUV/2.53   BUN/Creatinine Ratio 11 (L) 12 - 28   Sodium 140 134 - 144 mmol/L   Potassium 4.4 3.5 - 5.2 mmol/L   Chloride 102 96 - 106 mmol/L   CO2 25 20 - 29 mmol/L   Calcium 10.0 8.7 - 10.3 mg/dL  TSH     Status: None   Collection Time: 06/11/23 11:24 AM  Result Value Ref Range   TSH 2.29 0.40 - 4.50 mIU/L    PHQ2/9:    08/02/2023    8:39 AM 03/01/2023    9:11 AM 07/27/2022    7:43 AM 01/25/2022    8:16 AM 07/24/2021    8:15 AM  Depression screen PHQ 2/9  Decreased Interest 0 0 0 0 0  Down, Depressed, Hopeless 0 0 0 0 0  PHQ - 2 Score 0 0 0 0 0  Altered sleeping 0 0 0 1 0  Tired, decreased energy 0 3 3 3 3   Change in appetite 0 0 0 0 0  Feeling bad or failure about yourself  0 0 0 0 0  Trouble concentrating 0 0 0 0 1  Moving slowly or fidgety/restless 0 0 0 0 0  Suicidal thoughts 0 0  0 0  PHQ-9 Score 0 3 3 4 4   Difficult doing work/chores Not difficult at all        phq 9 is negative   Fall Risk:    08/02/2023    8:38 AM 03/01/2023    9:11 AM 07/27/2022    7:43 AM 01/25/2022    8:16 AM  07/24/2021    8:15 AM  Fall Risk   Falls in the past year? 1 1 0 0 1  Number falls in past yr: 1 1 0 0 0  Injury with Fall? 1 1 0 0 0  Risk for fall due to : Impaired balance/gait  No Fall Risks No Fall Risks No Fall Risks  Follow up Falls prevention discussed;Education provided;Falls evaluation completed Falls prevention discussed Falls prevention discussed Falls prevention discussed Falls prevention discussed     Assessment & Plan  Assessment and Plan    Hypertension Blood pressure elevated at today's visit (142/84) and during cardiology visit in October (148/90). Patient has been taking half of prescribed Valsartan HCTZ 80/12.5mg . -Resume full dose of Valsartan HCTZ 80/12.5mg  daily. -Monitor blood pressure at home.  Hyperlipidemia LDL slightly above goal. Patient declined statin therapy, preferring to manage with diet and exercise. -Encourage diet and exercise modifications.  Atherosclerosis Aorta Plaque noted on aorta during cardiac calcium score. No plaque in coronary arteries. -Continue aspirin 81mg  daily for secondary prevention.  Hashimoto's Thyroiditis Levothyroxine dose adjusted in July. Recent TSH in October was within normal range. -Continue Levothyroxine daily.  Seasonal Affective Disorde/.MDD in remission Patient experiences symptoms in winter months. Utilizes UV light and outdoor activity for management. -Continue current management strategies.  Obstructive Sleep Apnea Patient unable to tolerate CPAP. Reports sleeping on side or stomach. -Continue current sleep positioning.  General Health Maintenance -Consider Pneumococcal vaccine at age 5. -Return for follow-up and routine labs in 6 months.

## 2023-08-02 ENCOUNTER — Ambulatory Visit (INDEPENDENT_AMBULATORY_CARE_PROVIDER_SITE_OTHER): Payer: Commercial Managed Care - HMO | Admitting: Family Medicine

## 2023-08-02 ENCOUNTER — Encounter: Payer: Self-pay | Admitting: Family Medicine

## 2023-08-02 VITALS — BP 130/76 | HR 77 | Temp 98.0°F | Resp 16 | Ht 63.5 in | Wt 197.9 lb

## 2023-08-02 DIAGNOSIS — I700 Atherosclerosis of aorta: Secondary | ICD-10-CM | POA: Insufficient documentation

## 2023-08-02 DIAGNOSIS — E063 Autoimmune thyroiditis: Secondary | ICD-10-CM | POA: Diagnosis not present

## 2023-08-02 DIAGNOSIS — F325 Major depressive disorder, single episode, in full remission: Secondary | ICD-10-CM | POA: Diagnosis not present

## 2023-08-02 DIAGNOSIS — K219 Gastro-esophageal reflux disease without esophagitis: Secondary | ICD-10-CM

## 2023-08-02 DIAGNOSIS — I10 Essential (primary) hypertension: Secondary | ICD-10-CM

## 2023-08-02 DIAGNOSIS — E785 Hyperlipidemia, unspecified: Secondary | ICD-10-CM

## 2023-08-02 DIAGNOSIS — G4733 Obstructive sleep apnea (adult) (pediatric): Secondary | ICD-10-CM

## 2023-09-22 ENCOUNTER — Encounter: Payer: Self-pay | Admitting: Family Medicine

## 2023-09-23 ENCOUNTER — Emergency Department: Payer: 59

## 2023-09-23 ENCOUNTER — Emergency Department
Admission: EM | Admit: 2023-09-23 | Discharge: 2023-09-23 | Disposition: A | Discharge status: Home / Self Care | Payer: Self-pay | Attending: Emergency Medicine | Admitting: Emergency Medicine

## 2023-09-23 ENCOUNTER — Other Ambulatory Visit: Payer: Self-pay

## 2023-09-23 DIAGNOSIS — N12 Tubulo-interstitial nephritis, not specified as acute or chronic: Secondary | ICD-10-CM | POA: Diagnosis not present

## 2023-09-23 DIAGNOSIS — I10 Essential (primary) hypertension: Secondary | ICD-10-CM | POA: Insufficient documentation

## 2023-09-23 DIAGNOSIS — E876 Hypokalemia: Secondary | ICD-10-CM

## 2023-09-23 DIAGNOSIS — R1084 Generalized abdominal pain: Secondary | ICD-10-CM | POA: Diagnosis not present

## 2023-09-23 DIAGNOSIS — K5900 Constipation, unspecified: Secondary | ICD-10-CM | POA: Insufficient documentation

## 2023-09-23 DIAGNOSIS — R7401 Elevation of levels of liver transaminase levels: Secondary | ICD-10-CM

## 2023-09-23 DIAGNOSIS — R531 Weakness: Secondary | ICD-10-CM | POA: Diagnosis not present

## 2023-09-23 DIAGNOSIS — K8020 Calculus of gallbladder without cholecystitis without obstruction: Secondary | ICD-10-CM

## 2023-09-23 LAB — COMPREHENSIVE METABOLIC PANEL
ALT: 47 U/L — ABNORMAL HIGH (ref 0–44)
AST: 60 U/L — ABNORMAL HIGH (ref 15–41)
Albumin: 2.9 g/dL — ABNORMAL LOW (ref 3.5–5.0)
Alkaline Phosphatase: 119 U/L (ref 38–126)
Anion gap: 12 (ref 5–15)
BUN: 10 mg/dL (ref 8–23)
CO2: 27 mmol/L (ref 22–32)
Calcium: 8.3 mg/dL — ABNORMAL LOW (ref 8.9–10.3)
Chloride: 95 mmol/L — ABNORMAL LOW (ref 98–111)
Creatinine, Ser: 0.91 mg/dL (ref 0.44–1.00)
GFR, Estimated: >60 mL/min (ref >60)
Glucose, Bld: 131 mg/dL — ABNORMAL HIGH (ref 70–99)
Potassium: 2.5 mmol/L — CL (ref 3.5–5.1)
Sodium: 134 mmol/L — ABNORMAL LOW (ref 135–145)
Total Bilirubin: 0.7 mg/dL (ref 0.0–1.2)
Total Protein: 7.1 g/dL (ref 6.5–8.1)

## 2023-09-23 LAB — LIPASE, BLOOD: Lipase: 26 U/L (ref 11–51)

## 2023-09-23 LAB — CBC WITH DIFFERENTIAL/PLATELET
Abs Immature Granulocytes: 0.1 10*3/uL — ABNORMAL HIGH (ref 0.00–0.07)
Basophils Absolute: 0 10*3/uL (ref 0.0–0.1)
Basophils Relative: 0 %
Eosinophils Absolute: 0 10*3/uL (ref 0.0–0.5)
Eosinophils Relative: 0 %
HCT: 33.2 % — ABNORMAL LOW (ref 36.0–46.0)
Hemoglobin: 11.2 g/dL — ABNORMAL LOW (ref 12.0–15.0)
Immature Granulocytes: 1 %
Lymphocytes Relative: 8 %
Lymphs Abs: 1 10*3/uL (ref 0.7–4.0)
MCH: 28 pg (ref 26.0–34.0)
MCHC: 33.7 g/dL (ref 30.0–36.0)
MCV: 83 fL (ref 80.0–100.0)
Monocytes Absolute: 0.9 10*3/uL (ref 0.1–1.0)
Monocytes Relative: 7 %
Neutro Abs: 10.7 10*3/uL — ABNORMAL HIGH (ref 1.7–7.7)
Neutrophils Relative %: 84 %
Platelets: 297 10*3/uL (ref 150–400)
RBC: 4 MIL/uL (ref 3.87–5.11)
RDW: 13.6 % (ref 11.5–15.5)
WBC: 12.8 10*3/uL — ABNORMAL HIGH (ref 4.0–10.5)
nRBC: 0 % (ref 0.0–0.2)

## 2023-09-23 LAB — URINALYSIS, ROUTINE W REFLEX MICROSCOPIC
Bilirubin Urine: NEGATIVE
Glucose, UA: NEGATIVE mg/dL
Hgb urine dipstick: NEGATIVE
Ketones, ur: 5 mg/dL — AB
Leukocytes,Ua: NEGATIVE
Nitrite: NEGATIVE
Protein, ur: NEGATIVE mg/dL
Specific Gravity, Urine: 1.043 — ABNORMAL HIGH (ref 1.005–1.030)
pH: 7 (ref 5.0–8.0)

## 2023-09-23 LAB — HEPATITIS PANEL, ACUTE
HCV Ab: NONREACTIVE
Hep A IgM: NONREACTIVE
Hep B C IgM: NONREACTIVE
Hepatitis B Surface Ag: NONREACTIVE

## 2023-09-23 LAB — PHOSPHORUS: Phosphorus: 2.2 mg/dL — ABNORMAL LOW (ref 2.5–4.6)

## 2023-09-23 LAB — MAGNESIUM: Magnesium: 2.5 mg/dL — ABNORMAL HIGH (ref 1.7–2.4)

## 2023-09-23 MED ORDER — POTASSIUM CHLORIDE CRYS ER 10 MEQ PO TBCR: 10.0000 meq | EXTENDED_RELEASE_TABLET | Freq: Two times a day (BID) | ORAL | 0 refills | Status: DC

## 2023-09-23 MED ORDER — ONDANSETRON HCL 4 MG/2ML IJ SOLN
4.0000 mg | Freq: Once | INTRAMUSCULAR | Status: AC
  Administered 2023-09-23: 4 mg via INTRAVENOUS
  Filled 2023-09-23: qty 2

## 2023-09-23 MED ORDER — SODIUM CHLORIDE 0.9 % IV BOLUS
1000.0000 mL | Freq: Once | INTRAVENOUS | Status: AC
  Administered 2023-09-23: 1000 mL via INTRAVENOUS

## 2023-09-23 MED ORDER — POTASSIUM CHLORIDE 10 MEQ/100ML IV SOLN
10.0000 meq | Freq: Once | INTRAVENOUS | Status: AC
Start: 2023-09-23 — End: 2023-09-23
  Administered 2023-09-23: 10 meq via INTRAVENOUS
  Filled 2023-09-23: qty 100

## 2023-09-23 MED ORDER — POTASSIUM CHLORIDE CRYS ER 20 MEQ PO TBCR
40.0000 meq | EXTENDED_RELEASE_TABLET | Freq: Once | ORAL | Status: AC
  Administered 2023-09-23: 40 meq via ORAL
  Filled 2023-09-23: qty 2

## 2023-09-23 MED ORDER — LEVOFLOXACIN IN D5W 750 MG/150ML IV SOLN
750.0000 mg | Freq: Once | INTRAVENOUS | Status: AC
  Administered 2023-09-23: 750 mg via INTRAVENOUS
  Filled 2023-09-23: qty 150

## 2023-09-23 MED ORDER — LEVOFLOXACIN 750 MG PO TABS: 750.0000 mg | ORAL_TABLET | Freq: Every day | ORAL | 0 refills | Status: AC

## 2023-09-23 MED ORDER — IOHEXOL 300 MG/ML  SOLN
100.0000 mL | Freq: Once | INTRAMUSCULAR | Status: AC | PRN
  Administered 2023-09-23: 100 mL via INTRAVENOUS

## 2023-09-23 NOTE — ED Triage Notes (Signed)
 Pt to ED via POV from home. Pt reports constipation for several days and last BM was small on Friday. Pt reports hx of diverticulitis and believes it is a flare up. Pt also reports intermittent abd pain. Pt on phone in triage.

## 2023-09-23 NOTE — ED Provider Notes (Signed)
[facility] Provider Note    Event Date/Time   First MD Initiated Contact with Patient 09/23/23 0830     (approximate)   History   Constipation   HPI  Shari Long is a 63 y.o. female with history of hypertension, GERD, dyslipidemia, thyroid disease and as listed in EMR presents to the emergency department for treatment and evaluation of generalized weakness, nausea, vomiting, abdominal pressure and bloating with constipation.  She believes that she has had norovirus since being exposed by other family members.  She is no longer vomiting but still feels nauseated.  Symptoms have progressively worsened over the past 4 days.  She denies fever or diarrhea.      Physical Exam   Triage Vital Signs: ED Triage Vitals  Encounter Vitals Group     BP 09/23/23 0824 (!) 148/95     Systolic BP Percentile --      Diastolic BP Percentile --      Pulse Rate 09/23/23 0824 88     Resp 09/23/23 0824 20     Temp 09/23/23 0824 98.3 F (36.8 C)     Temp Source 09/23/23 0824 Oral     SpO2 09/23/23 0824 94 %     Weight --      Height --      Head Circumference --      Peak Flow --      Pain Score 09/23/23 0825 2     Pain Loc --      Pain Education --      Exclude from Growth Chart --     Most recent vital signs: Vitals:   09/23/23 0824  BP: (!) 148/95  Pulse: 88  Resp: 20  Temp: 98.3 F (36.8 C)  SpO2: 94%    General: Awake, no distress.  CV:  Good peripheral perfusion.  Resp:  Normal effort.  Clear to auscultation Abd:  No distention.  Bowel sounds present and active x 4 quadrants.  Generalized tenderness with palpation. Other:     ED Results / Procedures / Treatments   Labs (all labs ordered are listed, but only abnormal results are displayed) Labs Reviewed - No data to display   EKG  NSR, rate of 61, incomplete RBBB.  No significant change in comparison to October 2024.    RADIOLOGY  Image and radiology report reviewed and  interpreted by me. Radiology report consistent with the same.  DG abdomen shows a normal bowel gas pattern.  CT with contrast of the abdomen pelvis shows extensive perinephritic stranding with striated nephrogram in the right kidney compatible with pyelonephritis.  No stones or hydronephrosis noted.  There are also numerous stones within the gallbladder but no changes of acute cholecystitis or biliary ductal dilatation.  PROCEDURES:  Critical Care performed: No  Procedures   MEDICATIONS ORDERED IN ED:  Medications - No data to display   IMPRESSION / MDM / ASSESSMENT AND PLAN / ED COURSE   I have reviewed the triage note.  Differential diagnosis includes, but is not limited to, norovirus, influenza A, viral syndrome, dehydration, electrolyte abnormality, bowel obstruction, diverticulitis, colitis  Patient's presentation is most consistent with acute presentation with potential threat to life or bodily function.  63 year old female presenting to the emergency department for treatment and evaluation of viral symptoms with weakness, abdominal pressure and bloating with constipation.  See HPI for further details.  CBC with differential shows a leukocytosis with a white cell count of 12.8 with a  neutrophil count of 10.7.  Mild anemia with a hemoglobin of 11.2 with a hematocrit of 33.2.  CMP shows a hypokalemia at 2.5, chloride of 95, glucose of 131, albumin of 2.9 with an AST of 60 and ALT of 47.  Alkaline phosphatase is normal.  Total bilirubin is normal. IV and PO potassium ordered. Additional labs including hepatitis panel ordered although patient denies being out of the country recently. CT abdomen and pelvis ordered. Patient aware and agreeable to the plan.  CT results discussed with the patient and husband.  Patient denies urinary symptoms and or flank pain.  She has not noticed any blood in her urine.  Cholelithiasis may possibly be the underlying cause of the bloating sensation.   Pyelonephritis is an unexpected finding.  With the above findings and potassium of 2.5 with a leukocytosis and no clear answers for her symptoms, my plan was to admit her.  We discussed this at length and the patient would prefer to be discharged home with oral potassium and antibiotics with close follow-up from her primary care provider.  She will also call to schedule an appointment to see the GI specialist.  She was strongly advised that if at any point she feels that her symptoms are changing or something is getting worse to return to the emergency department. She agrees to do so.      FINAL CLINICAL IMPRESSION(S) / ED DIAGNOSES   Final diagnoses:  None     Rx / DC Orders   ED Discharge Orders     None        Note:  This document was prepared using Dragon voice recognition software and may include unintentional dictation errors.   Chinita Pester, FNP 09/24/23 1531    Jene Every, MD 09/27/23 [phone]

## 2023-09-23 NOTE — Discharge Instructions (Signed)
 Try to increase intake of foods rich in potassium.  Call today to schedule a follow up with your primary care provider as well as the GI specialist.  Return to the ER immediately for symptoms that change or worsen.

## 2023-10-09 ENCOUNTER — Encounter: Payer: Self-pay | Admitting: Family Medicine

## 2023-10-09 ENCOUNTER — Ambulatory Visit (INDEPENDENT_AMBULATORY_CARE_PROVIDER_SITE_OTHER): Payer: 59 | Admitting: Family Medicine

## 2023-10-09 VITALS — BP 138/84 | HR 79 | Temp 98.2°F | Resp 16 | Ht 63.5 in | Wt 196.8 lb

## 2023-10-09 DIAGNOSIS — E876 Hypokalemia: Secondary | ICD-10-CM | POA: Diagnosis not present

## 2023-10-09 DIAGNOSIS — R790 Abnormal level of blood mineral: Secondary | ICD-10-CM | POA: Diagnosis not present

## 2023-10-09 DIAGNOSIS — Z87448 Personal history of other diseases of urinary system: Secondary | ICD-10-CM | POA: Diagnosis not present

## 2023-10-09 DIAGNOSIS — K8020 Calculus of gallbladder without cholecystitis without obstruction: Secondary | ICD-10-CM

## 2023-10-09 DIAGNOSIS — D72829 Elevated white blood cell count, unspecified: Secondary | ICD-10-CM | POA: Diagnosis not present

## 2023-10-09 DIAGNOSIS — K219 Gastro-esophageal reflux disease without esophagitis: Secondary | ICD-10-CM

## 2023-10-09 NOTE — Progress Notes (Signed)
 Name: Shari Long   MRN: [id number]    DOB: 05/28/1961   Date:10/09/2023       Progress Note  Subjective  Chief Complaint  Chief Complaint  Patient presents with   Medical Management of Chronic Issues     Discussed the use of AI scribe software for clinical note transcription with the patient, who gave verbal consent to proceed.  History of Present Illness   The patient, seen at [facility] on Feb 10 th and is here today for follow up, she went for weakness and severe bloating and weakness following a flu-like illness.  She began experiencing severe bloating and nausea over a weekend, initially attributing it to sinus issues. By Monday, she developed chills, fever, and body aches, confirming a flu diagnosis. Despite initial improvement, symptoms worsened by Wednesday, leading to extreme weakness and an inability to get out of bed. She experienced vomiting and diarrhea, prompting a visit to the emergency room on September 22, 2013. she was sick for over 10 days prior to going to [facility]  In the emergency room, she was diagnosed with pyelonephritis on the right side, ( once at the [facility] she recalled having  symptoms of burning during urination that resolved after taking Pyridium for two to three days the week before). She also had low potassium levels, magnesium and phosphorus , contributing to her fatigue and weakness, and was treated with IV fluids with electrolytes  and oral potassium supplements.  Reviewed labs and CT done at [facility].    Patient finished antibiotics - Levaquin and also potassium tablets and started to feel better within 24 hours but has noticed some pain on right arm that is mild and intermittent and aggravated by movement since she left the EC.  She feels more like her normal self now       Patient Active Problem List   Diagnosis Date Noted   Atherosclerosis of aorta (HCC) 08/02/2023   Abnormal EKG 05/13/2023   Anti-RNP antibodies present 02/04/2020   Paresthesia and pain of left  extremity 02/04/2020   OSA (obstructive sleep apnea) 04/01/2017   Dyslipidemia 10/02/2016   Hypothyroidism due to Hashimoto's thyroiditis 03/29/2016   Major depression in remission (HCC) 03/29/2016   GERD without esophagitis 03/29/2016   Status post vaginal hysterectomy 11/24/2015   Vaginal atrophy 11/24/2015   Essential hypertension 12/25/2010    Past Surgical History:  Procedure Laterality Date   CARDIAC CATHETERIZATION     COLONOSCOPY WITH PROPOFOL N/A 01/26/2020   Procedure: COLONOSCOPY WITH PROPOFOL;  Surgeon: Midge Minium, MD;  Location: [facility] ENDOSCOPY;  Service: Endoscopy;  Laterality: N/A;   FOOT SURGERY  1978   left foot    TONSILLECTOMY     VAGINAL HYSTERECTOMY  2002    Family History  Problem Relation Age of Onset   Arrhythmia Mother    Cancer - Cervical Mother    Diabetes Mother    Atrial fibrillation Mother    Thyroid disease Sister    Breast cancer Paternal Uncle 78   Colon cancer Paternal Uncle    Heart disease Maternal Grandmother    Breast cancer Paternal Grandmother 9   Heart disease Paternal Grandfather    Breast cancer Cousin        ovarian (50's), breast(50's) and uterine(60's) cancer on maternal side.    Ovarian cancer Cousin    Breast cancer Other        great pat aunts x 2   Breast cancer Other  great pat uncle    Social History   Tobacco Use   Smoking status: Never   Smokeless tobacco: Never  Substance Use Topics   Alcohol use: Not Currently    Alcohol/week: 0.0 standard drinks of alcohol    Comment: rare     Current Outpatient Medications:    aspirin 81 MG tablet, Take 81 mg by mouth daily., Disp: , Rfl:    b complex vitamins capsule, Take 1 capsule by mouth daily., Disp: , Rfl:    calcium citrate-vitamin D 200-200 MG-UNIT TABS, Take 1 tablet by mouth daily., Disp: , Rfl:    L-Lysine 1000 MG TABS, Take by mouth 2 (two) times daily., Disp: , Rfl:    levothyroxine (SYNTHROID) 100 MCG tablet, Take 1 tablet (100 mcg total) by  mouth daily., Disp: 90 tablet, Rfl: 1   Multiple Vitamins tablet, , Disp: , Rfl:    Omega-3 Fatty Acids (RA FISH OIL) 1000 MG CAPS, Take by mouth., Disp: , Rfl:    potassium chloride (KLOR-CON M) 10 MEQ tablet, Take 1 tablet (10 mEq total) by mouth 2 (two) times daily., Disp: 30 tablet, Rfl: 0   valsartan-hydrochlorothiazide (DIOVAN-HCT) 80-12.5 MG tablet, Take 1 tablet by mouth daily., Disp: 90 tablet, Rfl: 1   vitamin E 400 UNIT capsule, Take 400 Units by mouth daily., Disp: , Rfl:   Allergies  Allergen Reactions   Latex Rash    Gloves when worn over extended period   Penicillins Rash    I personally reviewed active problem list, medication list, allergies, family history with the patient/caregiver today.   ROS  Ten systems reviewed and is negative except as mentioned in HPI    Objective  Vitals:   10/09/23 0931  BP: 138/84  Pulse: 79  Resp: 16  Temp: 98.2 F (36.8 C)  TempSrc: Oral  SpO2: 97%  Weight: 196 lb 12.8 oz (89.3 kg)  Height: 5' 3.5" (1.613 m)    Body mass index is 34.31 kg/m.  Physical Exam  Constitutional: Patient appears well-developed and well-nourished. Obese  No distress.  HEENT: head atraumatic, normocephalic, pupils equal and reactive to light, neck supple Cardiovascular: Normal rate, regular rhythm and normal heart sounds.  No murmur heard. No BLE edema. Pulmonary/Chest: Effort normal and breath sounds normal. No respiratory distress. Abdominal: Soft.  There is no tenderness. Negative CVA tenderness  Psychiatric: Patient has a normal mood and affect. behavior is normal. Judgment and thought content normal.   Recent Results (from the past 2160 hours)  Magnesium     Status: Abnormal   Collection Time: 09/23/23  8:52 AM  Result Value Ref Range   Magnesium 2.5 (H) 1.7 - 2.4 mg/dL    Comment: Performed at [facility], [address], [place], [place] [address]  Phosphorus     Status: Abnormal   Collection Time: 09/23/23  8:52 AM   Result Value Ref Range   Phosphorus 2.2 (L) 2.5 - 4.6 mg/dL    Comment: Performed at [facility], [address] [place], [place] [address]  Lipase, blood     Status: None   Collection Time: 09/23/23  8:53 AM  Result Value Ref Range   Lipase 26 11 - 51 U/L    Comment: Performed at [facility], [address] [place], [place] [address]  Comprehensive metabolic panel     Status: Abnormal   Collection Time: 09/23/23  8:53 AM  Result Value Ref Range   Sodium 134 (L) 135 - 145  mmol/L   Potassium 2.5 (LL) 3.5 - 5.1 mmol/L    Comment: CRITICAL RESULT CALLED TO, READ BACK BY AND VERIFIED WITH LINDA MCLAMB 09/23/23 @ 09559 BY SH    Chloride 95 (L) 98 - 111 mmol/L   CO2 27 22 - 32 mmol/L   Glucose, Bld 131 (H) 70 - 99 mg/dL    Comment: Glucose reference range applies only to samples taken after fasting for at least 8 hours.   BUN 10 8 - 23 mg/dL   Creatinine, Ser 5.17 0.44 - 1.00 mg/dL   Calcium 8.3 (L) 8.9 - 10.3 mg/dL   Total Protein 7.1 6.5 - 8.1 g/dL   Albumin 2.9 (L) 3.5 - 5.0 g/dL   AST 60 (H) 15 - 41 U/L   ALT 47 (H) 0 - 44 U/L   Alkaline Phosphatase 119 38 - 126 U/L   Total Bilirubin 0.7 0.0 - 1.2 mg/dL   GFR, Estimated >00 >17 mL/min    Comment: (NOTE) Calculated using the CKD-EPI Creatinine Equation (2021)    Anion gap 12 5 - 15    Comment: Performed at [facility], [address] [place], [place] [address]  CBC with Differential     Status: Abnormal   Collection Time: 09/23/23  8:53 AM  Result Value Ref Range   WBC 12.8 (H) 4.0 - 10.5 K/uL   RBC 4.00 3.87 - 5.11 MIL/uL   Hemoglobin 11.2 (L) 12.0 - 15.0 g/dL   HCT 67.5 (L) 91.6 - 38.4 %   MCV 83.0 80.0 - 100.0 fL   MCH 28.0 26.0 - 34.0 pg   MCHC 33.7 30.0 - 36.0 g/dL   RDW 66.5 99.3 - 57.0 %   Platelets 297 150 - 400 K/uL   nRBC 0.0 0.0 - 0.2 %   Neutrophils Relative % 84 %   Neutro Abs 10.7 (H) 1.7 - 7.7 K/uL   Lymphocytes Relative 8 %   Lymphs Abs 1.0 0.7 - 4.0 K/uL    Monocytes Relative 7 %   Monocytes Absolute 0.9 0.1 - 1.0 K/uL   Eosinophils Relative 0 %   Eosinophils Absolute 0.0 0.0 - 0.5 K/uL   Basophils Relative 0 %   Basophils Absolute 0.0 0.0 - 0.1 K/uL   Immature Granulocytes 1 %   Abs Immature Granulocytes 0.10 (H) 0.00 - 0.07 K/uL    Comment: Performed at [facility], [address] [place], [place] [address]  Urinalysis, Routine w reflex microscopic -Urine, Clean Catch     Status: Abnormal   Collection Time: 09/23/23  8:53 AM  Result Value Ref Range   Color, Urine YELLOW (A) YELLOW   APPearance CLEAR (A) CLEAR   Specific Gravity, Urine 1.043 (H) 1.005 - 1.030   pH 7.0 5.0 - 8.0   Glucose, UA NEGATIVE NEGATIVE mg/dL   Hgb urine dipstick NEGATIVE NEGATIVE   Bilirubin Urine NEGATIVE NEGATIVE   Ketones, ur 5 (A) NEGATIVE mg/dL   Protein, ur NEGATIVE NEGATIVE mg/dL   Nitrite NEGATIVE NEGATIVE   Leukocytes,Ua NEGATIVE NEGATIVE    Comment: Performed at [facility], [address] [place], [place] [address]  Hepatitis panel, acute     Status: None   Collection Time: 09/23/23 10:38 AM  Result Value Ref Range   Hepatitis B Surface Ag NON REACTIVE NON REACTIVE   HCV Ab NON REACTIVE NON REACTIVE    Comment: (NOTE) Nonreactive HCV antibody screen is consistent with no HCV infections,  unless recent infection is suspected or  other evidence exists to indicate HCV infection.     Hep A IgM NON REACTIVE NON REACTIVE   Hep B C IgM NON REACTIVE NON REACTIVE    Comment: Performed at [facility], [address], [place], [place] [address]    Diabetic Foot Exam:     PHQ2/9:    10/09/2023    9:30 AM 08/02/2023    8:39 AM 03/01/2023    9:11 AM 07/27/2022    7:43 AM 01/25/2022    8:16 AM  Depression screen PHQ 2/9  Decreased Interest 0 0 0 0 0  Down, Depressed, Hopeless 0 0 0 0 0  PHQ - 2 Score 0 0 0 0 0  Altered sleeping 0 0 0 0 1  Tired, decreased energy 0 0 3 3 3   Change in appetite 0 0 0 0 0  Feeling bad  or failure about yourself  0 0 0 0 0  Trouble concentrating 0 0 0 0 0  Moving slowly or fidgety/restless 0 0 0 0 0  Suicidal thoughts 0 0 0  0  PHQ-9 Score 0 0 3 3 4   Difficult doing work/chores Not difficult at all Not difficult at all       phq 9 is negative  Fall Risk:    08/02/2023    8:38 AM 03/01/2023    9:11 AM 07/27/2022    7:43 AM 01/25/2022    8:16 AM 07/24/2021    8:15 AM  Fall Risk   Falls in the past year? 1 1 0 0 1  Number falls in past yr: 1 1 0 0 0  Injury with Fall? 1 1 0 0 0  Risk for fall due to : Impaired balance/gait  No Fall Risks No Fall Risks No Fall Risks  Follow up Falls prevention discussed;Education provided;Falls evaluation completed Falls prevention discussed Falls prevention discussed Falls prevention discussed Falls prevention discussed     Assessment & Plan      Pyelonephritis: Presented with left-sided pain and high white blood cell count. Right kidney involved.  -Completed course of Levaquin and denies current symptoms  Electrolyte Imbalance: Presented with fatigue, confusion, and weakness likely secondary to electrolyte imbalance from prolonged illness and vomiting. -Check labs to reassess electrolyte levels.  Right Arm Pain: New onset, possibly related to recent IV placement. -Monitor and report if worsens or does not improve.  General Health Maintenance: -Encouraged to seek medical attention promptly if similar symptoms recur given recent severity of illness.

## 2023-10-10 ENCOUNTER — Encounter: Payer: Self-pay | Admitting: Family Medicine

## 2023-10-10 LAB — COMPLETE METABOLIC PANEL WITH GFR
AG Ratio: 1.1 (calc) (ref 1.0–2.5)
ALT: 17 U/L (ref 6–29)
AST: 28 U/L (ref 10–35)
Albumin: 3.8 g/dL (ref 3.6–5.1)
Alkaline phosphatase (APISO): 103 U/L (ref 37–153)
BUN: 11 mg/dL (ref 7–25)
CO2: 28 mmol/L (ref 20–32)
Calcium: 9.5 mg/dL (ref 8.6–10.4)
Chloride: 104 mmol/L (ref 98–110)
Creat: 0.8 mg/dL (ref 0.50–1.05)
Globulin: 3.4 g/dL (ref 1.9–3.7)
Glucose, Bld: 85 mg/dL (ref 65–99)
Potassium: 5 mmol/L (ref 3.5–5.3)
Sodium: 140 mmol/L (ref 135–146)
Total Bilirubin: 0.3 mg/dL (ref 0.2–1.2)
Total Protein: 7.2 g/dL (ref 6.1–8.1)
eGFR: 82 mL/min/{1.73_m2} (ref >=60)

## 2023-10-10 LAB — CBC WITH DIFFERENTIAL/PLATELET
Absolute Lymphocytes: 2244 {cells}/uL (ref 850–3900)
Absolute Monocytes: 429 {cells}/uL (ref 200–950)
Basophils Absolute: 92 {cells}/uL (ref 0–200)
Basophils Relative: 1.4 %
Eosinophils Absolute: 172 {cells}/uL (ref 15–500)
Eosinophils Relative: 2.6 %
HCT: 40.1 % (ref 35.0–45.0)
Hemoglobin: 12.5 g/dL (ref 11.7–15.5)
MCH: 27.5 pg (ref 27.0–33.0)
MCHC: 31.2 g/dL — ABNORMAL LOW (ref 32.0–36.0)
MCV: 88.3 fL (ref 80.0–100.0)
MPV: 9.2 fL (ref 7.5–12.5)
Monocytes Relative: 6.5 %
Neutro Abs: 3663 {cells}/uL (ref 1500–7800)
Neutrophils Relative %: 55.5 %
Platelets: 474 10*3/uL — ABNORMAL HIGH (ref 140–400)
RBC: 4.54 10*6/uL (ref 3.80–5.10)
RDW: 14.2 % (ref 11.0–15.0)
Total Lymphocyte: 34 %
WBC: 6.6 10*3/uL (ref 3.8–10.8)

## 2023-10-10 LAB — MAGNESIUM: Magnesium: 2.5 mg/dL (ref 1.5–2.5)

## 2023-10-10 LAB — PHOSPHORUS: Phosphorus: 3.7 mg/dL (ref 2.5–4.5)

## 2023-12-07 ENCOUNTER — Other Ambulatory Visit: Payer: Self-pay | Admitting: Family Medicine

## 2023-12-30 ENCOUNTER — Other Ambulatory Visit: Payer: Self-pay | Admitting: Family Medicine

## 2023-12-30 DIAGNOSIS — Z0000 Encounter for general adult medical examination without abnormal findings: Secondary | ICD-10-CM

## 2023-12-30 DIAGNOSIS — I10 Essential (primary) hypertension: Secondary | ICD-10-CM

## 2024-01-31 ENCOUNTER — Encounter: Payer: Self-pay | Admitting: Family Medicine

## 2024-01-31 ENCOUNTER — Ambulatory Visit (INDEPENDENT_AMBULATORY_CARE_PROVIDER_SITE_OTHER): Payer: Self-pay | Admitting: Family Medicine

## 2024-01-31 VITALS — BP 128/84 | HR 75 | Resp 16 | Ht 63.5 in | Wt 171.1 lb

## 2024-01-31 DIAGNOSIS — R634 Abnormal weight loss: Secondary | ICD-10-CM | POA: Diagnosis not present

## 2024-01-31 DIAGNOSIS — G4733 Obstructive sleep apnea (adult) (pediatric): Secondary | ICD-10-CM

## 2024-01-31 DIAGNOSIS — G9332 Myalgic encephalomyelitis/chronic fatigue syndrome: Secondary | ICD-10-CM | POA: Diagnosis not present

## 2024-01-31 DIAGNOSIS — E785 Hyperlipidemia, unspecified: Secondary | ICD-10-CM | POA: Diagnosis not present

## 2024-01-31 DIAGNOSIS — I700 Atherosclerosis of aorta: Secondary | ICD-10-CM

## 2024-01-31 DIAGNOSIS — R790 Abnormal level of blood mineral: Secondary | ICD-10-CM

## 2024-01-31 DIAGNOSIS — E876 Hypokalemia: Secondary | ICD-10-CM | POA: Diagnosis not present

## 2024-01-31 DIAGNOSIS — F325 Major depressive disorder, single episode, in full remission: Secondary | ICD-10-CM | POA: Diagnosis not present

## 2024-01-31 DIAGNOSIS — E063 Autoimmune thyroiditis: Secondary | ICD-10-CM | POA: Diagnosis not present

## 2024-01-31 DIAGNOSIS — I10 Essential (primary) hypertension: Secondary | ICD-10-CM | POA: Diagnosis not present

## 2024-01-31 MED ORDER — VALSARTAN-HYDROCHLOROTHIAZIDE 80-12.5 MG PO TABS: 0.5000 | ORAL_TABLET | Freq: Every day | ORAL | 1 refills | Status: DC

## 2024-01-31 NOTE — Progress Notes (Signed)
 Name: Shari Long   MRN: [id number]    DOB: 23-Nov-1960   Date:01/31/2024       Progress Note  Subjective  Chief Complaint  Chief Complaint  Patient presents with   Medical Management of Chronic Issues   Fatigue    X2 weeks   Discussed the use of AI scribe software for clinical note transcription with the patient, who gave verbal consent to proceed.  History of Present Illness Shari Long is a 63 year old female with chronic fatigue syndrome who presents with increased fatigue.  She has experienced increased fatigue over the past couple of weeks, which is unexpected given her recent weight loss and increased physical activity. The fatigue began last week and has persisted, making it difficult to stay motivated and complete tasks. She describes the fatigue as feeling 'draggy' and requiring more effort to perform daily activities. She also feels like she could fall asleep during the day, which is unusual for her.  She has a history of chronic fatigue syndrome, which typically comes and goes, but she was not expecting a flare-up at this time of year. No lymphadenopathy during flares. Aches and pains are related to weather, increasing before storms and resolving after the rain.  In February, she was hospitalized for low magnesium and potassium levels following a viral infection, which she struggled to recover from. She is concerned about these levels again and mentions that her magnesium and potassium were low at that time.  She has been following a physician-monitored weight loss program since March, involving a calorie-restricted diet combined with intermittent fasting. She has lost a significant amount of weight, dropping from 197 pounds in February to 171 pounds currently. Her diet includes lean proteins, vegetables, and limited fruits, with no breakfast and meals between noon and 8 PM. She does not feel hungry on this diet and has not used any supplements.  She has a history  of hypothyroidism, for which she takes levothyroxine  100 micrograms daily. No major changes in bowel movements, hair loss, or dry skin, noting that her hair has been thin and her skin dry for some time.  She also has a history of seasonal affective disorder, which she does not believe is contributing to her current fatigue as it is summer and she is spending time outdoors. No sleep apnea, stating she does not snore and does not use a CPAP machine.  Her past medical history includes major depression, which is in remission, and she is not currently on any medication for it. She manages her mood with lifestyle changes and sunlight exposure.    Patient Active Problem List   Diagnosis Date Noted   Atherosclerosis of aorta (HCC) 08/02/2023   Abnormal EKG 05/13/2023   Anti-RNP antibodies present 02/04/2020   Paresthesia and pain of left extremity 02/04/2020   OSA (obstructive sleep apnea) 04/01/2017   Dyslipidemia 10/02/2016   Hypothyroidism due to Hashimoto's thyroiditis 03/29/2016   Major depression in remission (HCC) 03/29/2016   GERD without esophagitis 03/29/2016   Status post vaginal hysterectomy 11/24/2015   Vaginal atrophy 11/24/2015   Essential hypertension 12/25/2010    Past Surgical History:  Procedure Laterality Date   CARDIAC CATHETERIZATION     COLONOSCOPY WITH PROPOFOL  N/A 01/26/2020   Procedure: COLONOSCOPY WITH PROPOFOL ;  Surgeon: Marnee Sink, MD;  Location: [facility] ENDOSCOPY;  Service: Endoscopy;  Laterality: N/A;   FOOT SURGERY  1978   left foot    TONSILLECTOMY     VAGINAL HYSTERECTOMY  2002  Family History  Problem Relation Age of Onset   Arrhythmia Mother    Cancer - Cervical Mother    Diabetes Mother    Atrial fibrillation Mother    Thyroid  disease Sister    Breast cancer Paternal Uncle 42   Colon cancer Paternal Uncle    Heart disease Maternal Grandmother    Breast cancer Paternal Grandmother 79   Heart disease Paternal Grandfather    Breast cancer Cousin         ovarian (50's), breast(50's) and uterine(60's) cancer on maternal side.    Ovarian cancer Cousin    Breast cancer Other        great pat aunts x 2   Breast cancer Other        great pat uncle    Social History   Tobacco Use   Smoking status: Never   Smokeless tobacco: Never  Substance Use Topics   Alcohol use: Not Currently    Alcohol/week: 0.0 standard drinks of alcohol    Comment: rare     Current Outpatient Medications:    aspirin 81 MG tablet, Take 81 mg by mouth daily., Disp: , Rfl:    b complex vitamins capsule, Take 1 capsule by mouth daily., Disp: , Rfl:    calcium citrate-vitamin D  200-200 MG-UNIT TABS, Take 1 tablet by mouth daily., Disp: , Rfl:    L-Lysine 1000 MG TABS, Take by mouth 2 (two) times daily., Disp: , Rfl:    levothyroxine  (SYNTHROID ) 100 MCG tablet, Take 1 tablet by mouth once daily, Disp: 90 tablet, Rfl: 0   Multiple Vitamins tablet, , Disp: , Rfl:    Omega-3 Fatty Acids (RA FISH OIL) 1000 MG CAPS, Take by mouth., Disp: , Rfl:    valsartan -hydrochlorothiazide  (DIOVAN -HCT) 80-12.5 MG tablet, Take 1 tablet by mouth once daily, Disp: 30 tablet, Rfl: 0   vitamin E 400 UNIT capsule, Take 400 Units by mouth daily., Disp: , Rfl:    potassium chloride  (KLOR-CON  M) 10 MEQ tablet, Take 1 tablet (10 mEq total) by mouth 2 (two) times daily. (Patient not taking: Reported on 01/31/2024), Disp: 30 tablet, Rfl: 0  Allergies  Allergen Reactions   Latex Rash    Gloves when worn over extended period   Penicillins Rash    I personally reviewed active problem list, medication list, allergies, family history with the patient/caregiver today.   ROS  Ten systems reviewed and is negative except as mentioned in HPI    Objective Physical Exam Constitutional: Patient appears well-developed and well-nourished. No distress.  HEENT: head atraumatic, normocephalic, pupils equal and reactive to light, neck supple Cardiovascular: Normal rate, regular rhythm and normal  heart sounds.  No murmur heard. No BLE edema. Pulmonary/Chest: Effort normal and breath sounds normal. No respiratory distress. Abdominal: Soft.  There is no tenderness. Muscular skeletal: no synovitis  Psychiatric: Patient has a normal mood and affect. behavior is normal. Judgment and thought content normal.     Vitals:   01/31/24 0832  BP: 128/84  Pulse: 75  Resp: 16  SpO2: 95%  Weight: 171 lb 1.6 oz (77.6 kg)  Height: 5' 3.5 (1.613 m)    Body mass index is 29.83 kg/m.   PHQ2/9:    01/31/2024    8:31 AM 10/09/2023    9:30 AM 08/02/2023    8:39 AM 03/01/2023    9:11 AM 07/27/2022    7:43 AM  Depression screen PHQ 2/9  Decreased Interest 0 0 0 0 0  Down, Depressed, Hopeless  0 0 0 0 0  PHQ - 2 Score 0 0 0 0 0  Altered sleeping 0 0 0 0 0  Tired, decreased energy 0 0 0 3 3  Change in appetite 0 0 0 0 0  Feeling bad or failure about yourself  0 0 0 0 0  Trouble concentrating 0 0 0 0 0  Moving slowly or fidgety/restless 0 0 0 0 0  Suicidal thoughts 0 0 0 0   PHQ-9 Score 0 0 0 3 3  Difficult doing work/chores Not difficult at all Not difficult at all Not difficult at all      phq 9 is negative  Fall Risk:    01/31/2024    8:29 AM 08/02/2023    8:38 AM 03/01/2023    9:11 AM 07/27/2022    7:43 AM 01/25/2022    8:16 AM  Fall Risk   Falls in the past year? 0 1 1 0 0  Number falls in past yr: 0 1 1 0 0  Injury with Fall? 0 1 1 0 0  Risk for fall due to : No Fall Risks Impaired balance/gait  No Fall Risks No Fall Risks  Follow up Falls prevention discussed;Education provided;Falls evaluation completed Falls prevention discussed;Education provided;Falls evaluation completed Falls prevention discussed Falls prevention discussed  Falls prevention discussed      Data saved with a previous flowsheet row definition      Assessment & Plan Chronic fatigue syndrome Intermittent fatigue exacerbated over two weeks. Possible flare of chronic fatigue syndrome. Differential  includes anemia, thyroid  dysfunction, vitamin deficiencies, and electrolyte imbalances. - Order CBC, TSH, vitamin D , B12, folate, magnesium, and potassium levels. - Order ESR and C-reactive protein.  Hypothyroidism, unspecified Managed with levothyroxine  100 mcg daily. Fatigue may be related to hypothyroidism. - Continue levothyroxine  100 mcg daily. - Order TSH.  Essential (primary) hypertension Blood pressure controlled at 128/84 mmHg on valsartan  HCTZ 40/12.5 mg, half tablet daily. Dizziness with higher doses. - Continue valsartan  HCTZ 40/12.5 mg, half tablet daily.  Atherosclerosis of aorta History of atherosclerosis with previous high LDL cholesterol. Statin therapy previously recommended but declined. - Order lipid panel.  Dyslipidemia Previous LDL cholesterol was 143 mg/dL and HDL was 44 mg/dL. Statin therapy previously recommended but declined. - Order lipid panel.  Major depressive disorder, in remission In remission. No current depressive symptoms. Manages seasonal affective disorder with lifestyle modifications.  Gastroesophageal reflux disease (GERD) Previous symptoms resolved with lifestyle modifications and weight loss.  General Health Maintenance Turning 63 in October. Discussed importance of 'Welcome to [facility]' visit for preventive care benefits. - Schedule 'Welcome to [facility]' visit after transition to Medicare. - Consider pneumonia and shingles vaccines after turning 63. - Annual influenza vaccination recommended.

## 2024-02-01 LAB — CBC WITH DIFFERENTIAL/PLATELET
Absolute Lymphocytes: 1765 {cells}/uL (ref 850–3900)
Absolute Monocytes: 335 {cells}/uL (ref 200–950)
Basophils Absolute: 50 {cells}/uL (ref 0–200)
Basophils Relative: 1 %
Eosinophils Absolute: 190 {cells}/uL (ref 15–500)
Eosinophils Relative: 3.8 %
HCT: 43.7 % (ref 35.0–45.0)
Hemoglobin: 13.8 g/dL (ref 11.7–15.5)
MCH: 28.9 pg (ref 27.0–33.0)
MCHC: 31.6 g/dL — ABNORMAL LOW (ref 32.0–36.0)
MCV: 91.6 fL (ref 80.0–100.0)
MPV: 9.3 fL (ref 7.5–12.5)
Monocytes Relative: 6.7 %
Neutro Abs: 2660 {cells}/uL (ref 1500–7800)
Neutrophils Relative %: 53.2 %
Platelets: 244 10*3/uL (ref 140–400)
RBC: 4.77 10*6/uL (ref 3.80–5.10)
RDW: 14.1 % (ref 11.0–15.0)
Total Lymphocyte: 35.3 %
WBC: 5 10*3/uL (ref 3.8–10.8)

## 2024-02-01 LAB — COMPREHENSIVE METABOLIC PANEL WITH GFR
AG Ratio: 1.8 (calc) (ref 1.0–2.5)
ALT: 14 U/L (ref 6–29)
AST: 23 U/L (ref 10–35)
Albumin: 4.2 g/dL (ref 3.6–5.1)
Alkaline phosphatase (APISO): 76 U/L (ref 37–153)
BUN/Creatinine Ratio: 8 (calc) (ref 6–22)
BUN: 6 mg/dL — ABNORMAL LOW (ref 7–25)
CO2: 24 mmol/L (ref 20–32)
Calcium: 9.3 mg/dL (ref 8.6–10.4)
Chloride: 105 mmol/L (ref 98–110)
Creat: 0.72 mg/dL (ref 0.50–1.05)
Globulin: 2.3 g/dL (ref 1.9–3.7)
Glucose, Bld: 88 mg/dL (ref 65–99)
Potassium: 4.1 mmol/L (ref 3.5–5.3)
Sodium: 139 mmol/L (ref 135–146)
Total Bilirubin: 0.3 mg/dL (ref 0.2–1.2)
Total Protein: 6.5 g/dL (ref 6.1–8.1)
eGFR: 93 mL/min/{1.73_m2} (ref >=60)

## 2024-02-01 LAB — C-REACTIVE PROTEIN: CRP: <3 mg/L (ref <8.0)

## 2024-02-01 LAB — LIPID PANEL
Cholesterol: 214 mg/dL — ABNORMAL HIGH (ref <200)
HDL: 47 mg/dL — ABNORMAL LOW (ref >=50)
LDL Cholesterol (Calc): 143 mg/dL — ABNORMAL HIGH
Non-HDL Cholesterol (Calc): 167 mg/dL — ABNORMAL HIGH (ref <130)
Total CHOL/HDL Ratio: 4.6 (calc) (ref <5.0)
Triglycerides: 121 mg/dL (ref <150)

## 2024-02-01 LAB — B12 AND FOLATE PANEL
Folate: >24 ng/mL
Vitamin B-12: 856 pg/mL (ref 200–1100)

## 2024-02-01 LAB — SEDIMENTATION RATE: Sed Rate: 6 mm/h (ref 0–30)

## 2024-02-01 LAB — VITAMIN D 25 HYDROXY (VIT D DEFICIENCY, FRACTURES): Vit D, 25-Hydroxy: 53 ng/mL (ref 30–100)

## 2024-02-01 LAB — MAGNESIUM: Magnesium: 2.3 mg/dL (ref 1.5–2.5)

## 2024-02-01 LAB — TSH: TSH: 1.02 m[IU]/L (ref 0.40–4.50)

## 2024-02-02 ENCOUNTER — Ambulatory Visit: Payer: Self-pay | Admitting: Family Medicine

## 2024-03-11 ENCOUNTER — Other Ambulatory Visit: Payer: Self-pay | Admitting: Family Medicine

## 2024-03-24 ENCOUNTER — Other Ambulatory Visit: Payer: Self-pay | Admitting: Family Medicine

## 2024-03-24 DIAGNOSIS — Z1231 Encounter for screening mammogram for malignant neoplasm of breast: Secondary | ICD-10-CM

## 2024-06-16 ENCOUNTER — Ambulatory Visit
Admission: RE | Admit: 2024-06-16 | Discharge: 2024-06-16 | Disposition: A | Discharge status: Home / Self Care | Source: Ambulatory Visit | Attending: Family Medicine | Admitting: Family Medicine

## 2024-06-16 DIAGNOSIS — Z1231 Encounter for screening mammogram for malignant neoplasm of breast: Secondary | ICD-10-CM | POA: Diagnosis not present

## 2024-06-18 ENCOUNTER — Telehealth: Payer: Self-pay | Admitting: Family Medicine

## 2024-06-18 NOTE — Telephone Encounter (Signed)
 Spoke with pt and she is able to keep this appointment said she got her dates wrong.

## 2024-06-18 NOTE — Telephone Encounter (Signed)
 Copied from CRM [id number]. Topic: Appointments - Appointment Scheduling >> Jun 18, 2024  8:31 AM Shari Long wrote: Patient needs to reschedule medicare appt for 11/18

## 2024-06-19 HISTORY — PX: MOUTH SURGERY: SHX715

## 2024-06-30 ENCOUNTER — Ambulatory Visit (INDEPENDENT_AMBULATORY_CARE_PROVIDER_SITE_OTHER): Admitting: Family Medicine

## 2024-06-30 ENCOUNTER — Encounter: Payer: Self-pay | Admitting: Family Medicine

## 2024-06-30 VITALS — BP 126/72 | HR 90 | Resp 16 | Ht 63.5 in | Wt 169.8 lb

## 2024-06-30 DIAGNOSIS — Z0000 Encounter for general adult medical examination without abnormal findings: Secondary | ICD-10-CM | POA: Diagnosis not present

## 2024-06-30 DIAGNOSIS — Z780 Asymptomatic menopausal state: Secondary | ICD-10-CM

## 2024-06-30 DIAGNOSIS — Z13820 Encounter for screening for osteoporosis: Secondary | ICD-10-CM | POA: Diagnosis not present

## 2024-06-30 NOTE — Patient Instructions (Addendum)
  Ms. Denison , Thank you for taking time to come for your Medicare Wellness Visit. I appreciate your ongoing commitment to your health goals. Please review the following plan we discussed and let me know if I can assist you in the future.   Check about PCV 20 vaccine  This is a list of the screening recommended for you and due dates:  Health Maintenance  Topic Date Due   DEXA scan (bone density measurement)  Never done   COVID-19 Vaccine (1 - 2025-26 season) 07/16/2024*   Zoster (Shingles) Vaccine (1 of 2) 09/30/2024*   Flu Shot  11/10/2024*   Pneumococcal Vaccine for age over 6 (1 of 1 - PCV) 06/30/2025*   Breast Cancer Screening  06/16/2025   Medicare Annual Wellness Visit  06/30/2025   DTaP/Tdap/Td vaccine (3 - Td or Tdap) 01/07/2030   Colon Cancer Screening  01/25/2030   Hepatitis C Screening  Completed   HIV Screening  Completed   Hepatitis B Vaccine  Aged Out   Meningitis B Vaccine  Aged Out  *Topic was postponed. The date shown is not the original due date.

## 2024-06-30 NOTE — Progress Notes (Signed)
 Chief Complaint  Patient presents with   Medicare Wellness     Subjective:   Shari Long is a 63 y.o. female who presents for a Welcome to Medicare Exam.   Allergies (verified) Latex and Penicillins   History: Past Medical History:  Diagnosis Date   DDD (degenerative disc disease), lumbar    Degenerative disc disease    Family history of adverse reaction to anesthesia    Father - Malignant hyperthermia   Fatigue    Hypertension    Increased BMI    Menopause    Neurotrophic keratitis of left eye    Numbness of foot    left   Seasonal allergies    Thyroid  disease    hypothyroidism   Vaginal atrophy    Past Surgical History:  Procedure Laterality Date   CARDIAC CATHETERIZATION     COLONOSCOPY WITH PROPOFOL  N/A 01/26/2020   Procedure: COLONOSCOPY WITH PROPOFOL ;  Surgeon: Jinny Carmine, MD;  Location: [facility] ENDOSCOPY;  Service: Endoscopy;  Laterality: N/A;   FOOT SURGERY  08/13/1976   left foot    MOUTH SURGERY  06/19/2024   tooth implant   TONSILLECTOMY     VAGINAL HYSTERECTOMY  08/13/2000   Family History  Problem Relation Age of Onset   Arrhythmia Mother    Cancer - Cervical Mother    Diabetes Mother    Atrial fibrillation Mother    Thyroid  disease Sister    Breast cancer Paternal Uncle 36   Colon cancer Paternal Uncle    Heart disease Maternal Grandmother    Breast cancer Paternal Grandmother 79   Heart disease Paternal Grandfather    Breast cancer Cousin        ovarian (50's), breast(50's) and uterine(60's) cancer on maternal side.    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  Tobacco Use   Smoking status: Never   Smokeless tobacco: Never  Vaping Use   Vaping status: Never Used  Substance and Sexual Activity   Alcohol use: Not Currently    Alcohol/week: 0.0  standard drinks of alcohol    Comment: rare   Drug use: No   Sexual activity: Not Currently    Partners: Male    Birth control/protection: Surgical   Tobacco Counseling Counseling given: Not Answered  SDOH Screenings   Food Insecurity: No Food Insecurity (06/30/2024)  Housing: Unknown (06/30/2024)  Transportation Needs: No Transportation Needs (06/30/2024)  Utilities: Not At Risk (06/30/2024)  Alcohol Screen: Low Risk  (03/01/2023)  Depression (PHQ2-9): Low Risk  (06/30/2024)  Financial Resource Strain: Low Risk  (03/01/2023)  Physical Activity: Sufficiently Active (06/30/2024)  Social Connections: Socially Integrated (06/30/2024)  Stress: No Stress Concern Present (06/30/2024)  Tobacco Use: Low Risk  (06/30/2024)  Health Literacy: Adequate Health Literacy (06/30/2024)   See flowsheets for full screening details  Depression Screen PHQ 2 & 9 Depression Scale- Over the past 2 weeks, how often have you been bothered by any of the following problems? Little interest or pleasure in doing things: 0 Feeling down, depressed, or hopeless (PHQ Adolescent also includes...irritable): 0 PHQ-2 Total Score: 0 Trouble falling or staying asleep, or sleeping too much: 0 Feeling tired or having little energy: 0 Poor  appetite or overeating (PHQ Adolescent also includes...weight loss): 0 Feeling bad about yourself - or that you are a failure or have let yourself or your family down: 0 Trouble concentrating on things, such as reading the newspaper or watching television (PHQ Adolescent also includes...like school work): 0 Moving or speaking so slowly that other people could have noticed. Or the opposite - being so fidgety or restless that you have been moving around a lot more than usual: 0 Thoughts that you would be better off dead, or of hurting yourself in some way: 0 PHQ-9 Total Score: 0 If you checked off any problems, how difficult have these problems made it for you to do your work, take care  of things at home, or get along with other people?: Not difficult at all      Goals Addressed             This Visit's Progress    Weight (lb) < 200 lb (90.7 kg)   169 lb 12.8 oz (77 kg)      Visit info / Clinical Intake: Medicare Wellness Visit Type:: Welcome to [employer] GOVERNMENT SOCIAL RESEARCH OFFICER) Persons participating in visit:: patient Medicare Wellness Visit Mode:: In-person (required for WTM) Information given by:: patient Interpreter Needed?: No Pre-visit prep was completed: no AWV questionnaire completed by patient prior to visit?: no Living arrangements:: lives with spouse/significant other Patient's Overall Health Status Rating: good Typical amount of pain: none Does pain affect daily life?: no Are you currently prescribed opioids?: no  Dietary Habits and Nutritional Risks How many meals a day?: 2 Eats fruit and vegetables daily?: yes Most meals are obtained by: preparing own meals In the last 2 weeks, have you had any of the following?: none Diabetic:: no  Functional Status Activities of Daily Living (to include ambulation/medication): Independent Ambulation: Independent Medication Administration: Independent Home Management: Independent Manage your own finances?: yes Primary transportation is: driving Concerns about vision?: (!) yes (cataracts) Concerns about hearing?: no  Fall Screening Falls in the past year?: 0 Number of falls in past year: 0 Was there an injury with Fall?: 0 Fall Risk Category Calculator: 0 Patient Fall Risk Level: Low Fall Risk  Fall Risk Patient at Risk for Falls Due to: No Fall Risks Fall risk Follow up: Falls evaluation completed  Home and Transportation Safety: All rugs have non-skid backing?: yes All stairs or steps have railings?: yes Grab bars in the bathtub or shower?: yes Have non-skid surface in bathtub or shower?: (!) no Good home lighting?: yes Regular seat belt use?: yes Hospital stays in the last year:: no  Cognitive  Assessment Difficulty concentrating, remembering, or making decisions? : yes (remembering) Will 6CIT or Mini Cog be Completed: yes What year is it?: 0 points What month is it?: 0 points Give patient an address phrase to remember (5 components): [address] [place] [address] About what time is it?: 0 points Count backwards from 20 to 1: 0 points Say the months of the year in reverse: 0 points Repeat the address phrase from earlier: 0 points 6 CIT Score: 0 points  Advance Directives (For Healthcare) Does Patient Have a Medical Advance Directive?: No Would patient like information on creating a medical advance directive?: Yes (Inpatient - patient defers creating a medical advance directive at this time - Information given)  Reviewed/Updated  Reviewed/Updated: Reviewed All (Medical, Surgical, Family, Medications, Allergies, Care Teams, Patient Goals); Medical History; Surgical History; Family History; Medications; Allergies; Care Teams; Patient Goals  Objective:    Today's Vitals   06/30/24 0828 06/30/24 0912  BP: 132/76 126/72  Pulse: 90   Resp: 16   SpO2: 98%   Weight: 169 lb 12.8 oz (77 kg)   Height: 5' 3.5 (1.613 m)    Body mass index is 29.61 kg/m.   Physical Exam  Constitutional: Patient appears well-developed and well-nourished. Obese  No distress.  HEENT: head atraumatic, normocephalic, pupils equal and reactive to light, neck supple, throat within normal limits Cardiovascular: Normal rate, regular rhythm and normal heart sounds.  No murmur heard. No BLE edema. Pulmonary/Chest: Effort normal and breath sounds normal. No respiratory distress. Abdominal: Soft.  There is no tenderness. Psychiatric: Patient has a normal mood and affect. behavior is normal. Judgment and thought content normal.   Current Medications (verified) Outpatient Encounter Medications as of 06/30/2024  Medication Sig   aspirin 81 MG tablet Take 81 mg by mouth daily.   b complex vitamins  capsule Take 1 capsule by mouth daily.   calcium citrate-vitamin D  200-200 MG-UNIT TABS Take 1 tablet by mouth daily.   L-Lysine 1000 MG TABS Take by mouth 2 (two) times daily.   levothyroxine  (SYNTHROID ) 100 MCG tablet Take 1 tablet by mouth once daily   Multiple Vitamins tablet    Omega-3 Fatty Acids (RA FISH OIL) 1000 MG CAPS Take by mouth.   POTASSIUM BICARBONATE PO Take 1,020 mg by mouth 3 (three) times a week.   valsartan -hydrochlorothiazide  (DIOVAN -HCT) 80-12.5 MG tablet Take 0.5 tablets by mouth daily.   vitamin E 400 UNIT capsule Take 400 Units by mouth daily.   No facility-administered encounter medications on file as of 06/30/2024.   Hearing/Vision screen Hearing Screening   500Hz  1000Hz  2000Hz  4000Hz   Right ear Pass Pass Pass Pass  Left ear Pass Pass Pass Pass   Vision Screening   Right eye Left eye Both eyes  Without correction 20/30 20/40 20/40   With correction      Immunizations and Health Maintenance Health Maintenance  Topic Date Due   DEXA SCAN  Never done   COVID-19 Vaccine (1 - 2025-26 season) 07/16/2024 (Originally 04/13/2024)   Zoster Vaccines- Shingrix (1 of 2) 09/30/2024 (Originally 06/01/2009)   Influenza Vaccine  11/10/2024 (Originally 03/13/2024)   Pneumococcal Vaccine: 50+ Years (1 of 1 - PCV) 06/30/2025 (Originally 06/01/2009)   Mammogram  06/16/2025   Medicare Annual Wellness (AWV)  06/30/2025   DTaP/Tdap/Td (3 - Td or Tdap) 01/07/2030   Colonoscopy  01/25/2030   Hepatitis C Screening  Completed   HIV Screening  Completed   Hepatitis B Vaccines 19-59 Average Risk  Aged Out   Meningococcal B Vaccine  Aged Out    EKG: normal EKG, normal sinus rhythm, unchanged from previous tracings     Assessment/Plan:   1. Encounter for Medicare annual wellness exam (Primary)  - DG Bone Density; Future - Visual acuity screening - Hearing screening; Future - EKG 12-Lead  2. Encounter for osteoporosis screening in asymptomatic postmenopausal patient  - DG  Bone Density; Future   This is a routine wellness examination for Bettyjean.  Patient Care Team: Sheleen Conchas, MD as PCP - General (Family Medicine)  I have personally reviewed and noted the following in the patient's chart:   Medical and social history Use of alcohol, tobacco or illicit drugs  Current medications and supplements including opioid prescriptions. Functional ability and status Nutritional status Physical activity Advanced directives List of other physicians Hospitalizations, surgeries, and ER visits in previous 12 months Vitals  Screenings to include cognitive, depression, and falls Referrals and appointments    In addition, I have reviewed and discussed with patient certain preventive protocols, quality metrics, and best practice recommendations. A written personalized care plan for preventive services as well as general preventive health recommendations were provided to patient.   Jermika Olden F Erland Vivas, MD   06/30/2024   Return in 1 year (on 06/30/2025) for medicare well with LPN, CPE when due.

## 2024-07-28 ENCOUNTER — Other Ambulatory Visit: Payer: Self-pay | Admitting: Family Medicine

## 2024-07-28 DIAGNOSIS — I10 Essential (primary) hypertension: Secondary | ICD-10-CM

## 2024-07-30 NOTE — Telephone Encounter (Signed)
 Requested Prescriptions  Pending Prescriptions Disp Refills   valsartan -hydrochlorothiazide  (DIOVAN -HCT) 80-12.5 MG tablet [Pharmacy Med Name: Valsartan -hydroCHLOROthiazide  80-12.5 MG Oral Tablet] 45 tablet 0    Sig: Take 1/2 (one-half) tablet by mouth once daily     Cardiovascular: ARB + Diuretic Combos Failed - 07/30/2024  5:29 PM      Failed - K in normal range and within 180 days    Potassium  Date Value Ref Range Status  01/31/2024 4.1 3.5 - 5.3 mmol/L Final  10/14/2013 3.7 3.5 - 5.1 mmol/L Final         Failed - Na in normal range and within 180 days    Sodium  Date Value Ref Range Status  01/31/2024 139 135 - 146 mmol/L Final  05/14/2023 140 134 - 144 mmol/L Final  10/14/2013 139 136 - 145 mmol/L Final         Failed - Cr in normal range and within 180 days    Creat  Date Value Ref Range Status  01/31/2024 0.72 0.50 - 1.05 mg/dL Final         Failed - eGFR is 10 or above and within 180 days    GFR, Est African American  Date Value Ref Range Status  01/10/2021 113 > OR = 60 mL/min/1.47m2 Final   GFR, Est Non African American  Date Value Ref Range Status  01/10/2021 97 > OR = 60 mL/min/1.24m2 Final   GFR, Estimated  Date Value Ref Range Status  09/23/2023 >60 >60 mL/min Final    Comment:    (NOTE) Calculated using the CKD-EPI Creatinine Equation (2021)    eGFR  Date Value Ref Range Status  01/31/2024 93 > OR = 60 mL/min/1.42m2 Final  05/14/2023 82 >59 mL/min/1.73 Final         Failed - Valid encounter within last 6 months    Recent Outpatient Visits           1 month ago Encounter for [employer] annual wellness exam   [facility] Glenard Mire, MD   6 months ago Atherosclerosis of aorta   [facility] Glenard Mire, MD   9 months ago Hypokalemia   [facility] Glenard Mire, MD       Future Appointments             In 2 weeks Sowles, Krichna, MD [facility] [place] - Patient is not pregnant      Passed - Last BP in normal range    BP Readings from Last 1 Encounters:  06/30/24 126/72

## 2024-08-18 ENCOUNTER — Encounter: Payer: Self-pay | Admitting: Family Medicine

## 2024-08-18 ENCOUNTER — Ambulatory Visit: Admitting: Family Medicine

## 2024-08-18 VITALS — BP 118/76 | HR 70 | Resp 16 | Ht 63.5 in | Wt 177.9 lb

## 2024-08-18 DIAGNOSIS — I10 Essential (primary) hypertension: Secondary | ICD-10-CM

## 2024-08-18 DIAGNOSIS — E785 Hyperlipidemia, unspecified: Secondary | ICD-10-CM | POA: Diagnosis not present

## 2024-08-18 DIAGNOSIS — G4733 Obstructive sleep apnea (adult) (pediatric): Secondary | ICD-10-CM | POA: Diagnosis not present

## 2024-08-18 DIAGNOSIS — E063 Autoimmune thyroiditis: Secondary | ICD-10-CM

## 2024-08-18 DIAGNOSIS — E538 Deficiency of other specified B group vitamins: Secondary | ICD-10-CM | POA: Diagnosis not present

## 2024-08-18 DIAGNOSIS — E559 Vitamin D deficiency, unspecified: Secondary | ICD-10-CM

## 2024-08-18 DIAGNOSIS — R0981 Nasal congestion: Secondary | ICD-10-CM

## 2024-08-18 DIAGNOSIS — I700 Atherosclerosis of aorta: Secondary | ICD-10-CM | POA: Diagnosis not present

## 2024-08-18 MED ORDER — VALSARTAN 40 MG PO TABS: 40.0000 mg | ORAL_TABLET | Freq: Every day | ORAL | 1 refills | Status: AC

## 2024-08-18 MED ORDER — LEVOTHYROXINE SODIUM 100 MCG PO TABS: 100.0000 ug | ORAL_TABLET | Freq: Every day | ORAL | 1 refills | Status: AC

## 2024-08-18 MED ORDER — FLUTICASONE PROPIONATE 50 MCG/ACT NA SUSP: 2.0000 | Freq: Every day | NASAL | 1 refills | Status: AC

## 2024-08-18 NOTE — Progress Notes (Signed)
 Name: Shari Long   MRN: [id number]    DOB: 1961-08-13   Date:08/18/2024       Progress Note  Subjective  Chief Complaint  Chief Complaint  Patient presents with   Medical Management of Chronic Issues   Discussed the use of AI scribe software for clinical note transcription with the patient, who gave verbal consent to proceed.  History of Present Illness Shari Long is a 64 year old female who presents for a routine follow-up visit.  Her last regular follow-up was in June, and she had a Medicare wellness visit in November, during which she underwent a mammogram and was scheduled for a bone density test. The mammogram was normal, but she has not yet completed the bone density test.  She has a history of hypothyroidism, specifically Hashimoto's thyroiditis, and is currently taking levothyroxine  100 micrograms daily. She takes it in the morning before eating or drinking anything. Her thyroid  levels have fluctuated in the past. She has experienced hair loss, which she attributes to weight loss, and has been using collagen supplements and a different shampoo, which seem to have helped. Her mother and aunt have also experienced significant hair loss.  She has a history of dyslipidemia. Her last lipid panel in June showed her LDL cholesterol was 143, which is an improvement from previous levels. She is not currently taking any cholesterol medications and prefers to manage her cholesterol through diet and exercise. She rides a stationary bike five miles a day, five days a week. She is trying to increase her fish intake and has had some dental issues that have affected her ability to eat certain foods.  She is currently taking valsartan  HCTZ, half a pill daily, for blood pressure management. She experiences dizziness and orthostatic symptoms, which she manages by taking her medication at night. She hopes to eventually discontinue the medication as she loses more weight.  She has a  history of mild obstructive sleep apnea, diagnosed through a sleep study. She does not use a CPAP machine as it disrupts her sleep. She sleeps on her side or stomach, which helps mitigate her symptoms.  She has not received a flu shot or pneumonia vaccine, expressing discomfort with these vaccinations. She has researched the Prevnar 20 vaccine but is not yet convinced to receive it.  She has been experiencing nasal congestion and has used Flonase  in the past, which she finds helpful. She requests a refill for Flonase  to manage her symptoms. She has had a sinus cold recently.    The 10-year ASCVD risk score (Arnett DK, et al., 2019) is: 7%   Values used to calculate the score:     Age: 3 years     Clinically relevant sex: Female     Is Non-Hispanic African American: No     Diabetic: No     Tobacco smoker: No     Systolic Blood Pressure: 118 mmHg     Is BP treated: Yes     HDL Cholesterol: 47 mg/dL     Total Cholesterol: 214 mg/dL    Patient Active Problem List   Diagnosis Date Noted   Chronic fatigue syndrome 01/31/2024   Atherosclerosis of aorta 08/02/2023   Abnormal EKG 05/13/2023   Anti-RNP antibodies present 02/04/2020   Paresthesia and pain of left extremity 02/04/2020   OSA (obstructive sleep apnea) 04/01/2017   Dyslipidemia 10/02/2016   Hypothyroidism due to Hashimoto's thyroiditis 03/29/2016   Major depression in remission 03/29/2016   GERD without  esophagitis 03/29/2016   Status post vaginal hysterectomy 11/24/2015   Vaginal atrophy 11/24/2015   Essential hypertension 12/25/2010    Past Surgical History:  Procedure Laterality Date   CARDIAC CATHETERIZATION     COLONOSCOPY WITH PROPOFOL  N/A 01/26/2020   Procedure: COLONOSCOPY WITH PROPOFOL ;  Surgeon: Jinny Carmine, MD;  Location: [facility] ENDOSCOPY;  Service: Endoscopy;  Laterality: N/A;   FOOT SURGERY  08/13/1976   left foot    MOUTH SURGERY  06/19/2024   tooth implant   TONSILLECTOMY     VAGINAL HYSTERECTOMY   08/13/2000    Family History  Problem Relation Age of Onset   Arrhythmia Mother    Cancer - Cervical Mother    Diabetes Mother    Atrial fibrillation Mother    Thyroid  disease Sister    Breast cancer Paternal Uncle 72   Colon cancer Paternal Uncle    Heart disease Maternal Grandmother    Breast cancer Paternal Grandmother 41   Heart disease Paternal Grandfather    Breast cancer Cousin        ovarian (50's), breast(50's) and uterine(60's) cancer on maternal side.    Ovarian cancer Cousin    Breast cancer Other        great pat aunts x 2   Breast cancer Other        great pat uncle    Social History   Tobacco Use   Smoking status: Never   Smokeless tobacco: Never  Substance Use Topics   Alcohol use: Not Currently    Alcohol/week: 0.0 standard drinks of alcohol    Comment: rare    Current Medications[1]  Allergies[2]  I personally reviewed active problem list, medication list, allergies, family history with the patient/caregiver today.   ROS  Ten systems reviewed and is negative except as mentioned in HPI    Objective Physical Exam  CONSTITUTIONAL: Patient appears well-developed and well-nourished. No distress. HEENT: Head atraumatic, normocephalic, neck supple. CARDIOVASCULAR: Normal rate, regular rhythm and normal heart sounds. No murmur heard. No BLE edema. PULMONARY: Effort normal and breath sounds normal. Lungs clear to auscultation bilaterally. No respiratory distress. ABDOMINAL: There is no tenderness or distention. MUSCULOSKELETAL: Normal gait. Without gross motor or sensory deficit. PSYCHIATRIC: Patient has a normal mood and affect. Behavior is normal. Judgment and thought content normal.  Vitals:   08/18/24 0821  BP: 118/76  Pulse: 70  Resp: 16  SpO2: 97%  Weight: 177 lb 14.4 oz (80.7 kg)  Height: 5' 3.5 (1.613 m)    Body mass index is 31.02 kg/m.   PHQ2/9:    08/18/2024    8:19 AM 06/30/2024    8:39 AM 01/31/2024    8:31 AM 10/09/2023     9:30 AM 08/02/2023    8:39 AM  Depression screen PHQ 2/9  Decreased Interest 0 0 0 0 0  Down, Depressed, Hopeless 0 0 0 0 0  PHQ - 2 Score 0 0 0 0 0  Altered sleeping 0 0 0 0 0  Tired, decreased energy 0 0 0 0 0  Change in appetite 0 0 0 0 0  Feeling bad or failure about yourself  0 0 0 0 0  Trouble concentrating 0 0 0 0 0  Moving slowly or fidgety/restless 0 0 0 0 0  Suicidal thoughts 0 0 0 0 0  PHQ-9 Score 0 0 0  0  0   Difficult doing work/chores Not difficult at all Not difficult at all Not difficult at all Not difficult at  all Not difficult at all     Data saved with a previous flowsheet row definition    phq 9 is negative  Fall Risk:    08/18/2024    8:18 AM 06/30/2024    8:31 AM 01/31/2024    8:29 AM 08/02/2023    8:38 AM 03/01/2023    9:11 AM  Fall Risk   Falls in the past year? 0 0 0 1 1  Number falls in past yr: 0 0 0 1 1  Injury with Fall? 0 0  0  1  1   Risk for fall due to : No Fall Risks No Fall Risks No Fall Risks Impaired balance/gait   Follow up Falls evaluation completed Falls evaluation completed Falls prevention discussed;Education provided;Falls evaluation completed Falls prevention discussed;Education provided;Falls evaluation completed Falls prevention discussed     Data saved with a previous flowsheet row definition    Assessment & Plan Hypothyroidism due to Hashimoto's thyroiditis Thyroid  levels fluctuate, currently on levothyroxine  100 mcg daily. Hair loss possibly related to weight loss, improved with collagen supplement and new shampoo. - Checked TSH levels. - Advised to avoid B complex supplements 3 days before blood draw if TSH is abnormal.  Essential hypertension Blood pressure well-controlled at 118/76 mmHg on valsartan  HCTZ 40/6.25 mg. Experiences dizziness and orthostatic issues. Goal to discontinue HCTZ and continue valsartan  alone. - Prescribed valsartan  40 mg without HCTZ. - Monitor blood pressure.  Dyslipidemia LDL improved to  143 mg/dL, HDL slightly low at 47 mg/dL. Prefers lifestyle modifications over statins due to family history of side effects. - Encouraged increased fish intake. - Continue regular exercise.  Obstructive sleep apnea Mild apnea noted, prefers side or stomach sleeping. CPAP not tolerated due to discomfort and sleep disruption. - Encouraged side or stomach sleeping.  Low serum vitamin B12 Previous low B12 levels, currently taking supplements. Last check was normal. - Checked B12 and folate levels.  Vitamin D  deficiency Previous deficiency, currently taking vitamin D  with calcium. Last check was normal. - Checked vitamin D  levels.  Nasal congestion Persistent nasal congestion, previously managed with Flonase . - Prescribed Flonase  for nasal congestion.  General Health Maintenance Discussed flu and pneumonia vaccinations. Hesitant about pneumonia vaccine but considering it. Discussed benefits of PCV20 vaccine, which is one-time and reduces risk of pneumococcal pneumonia. - Consider PCV20 vaccine. - Discussed flu vaccine options.        [1]  Current Outpatient Medications:    aspirin 81 MG tablet, Take 81 mg by mouth daily., Disp: , Rfl:    b complex vitamins capsule, Take 1 capsule by mouth daily., Disp: , Rfl:    calcium citrate-vitamin D  200-200 MG-UNIT TABS, Take 1 tablet by mouth daily., Disp: , Rfl:    L-Lysine 1000 MG TABS, Take by mouth 2 (two) times daily., Disp: , Rfl:    levothyroxine  (SYNTHROID ) 100 MCG tablet, Take 1 tablet by mouth once daily, Disp: 90 tablet, Rfl: 1   Multiple Vitamins tablet, , Disp: , Rfl:    Omega-3 Fatty Acids (RA FISH OIL) 1000 MG CAPS, Take by mouth., Disp: , Rfl:    POTASSIUM BICARBONATE PO, Take 1,020 mg by mouth 3 (three) times a week., Disp: , Rfl:    valsartan -hydrochlorothiazide  (DIOVAN -HCT) 80-12.5 MG tablet, Take 1/2 (one-half) tablet by mouth once daily, Disp: 45 tablet, Rfl: 0   vitamin E 400 UNIT capsule, Take 400 Units by mouth daily.,  Disp: , Rfl:  [2]  Allergies Allergen Reactions   Latex Rash  Gloves when worn over extended period   Penicillins Rash

## 2024-08-19 ENCOUNTER — Ambulatory Visit: Payer: Self-pay | Admitting: Family Medicine

## 2024-08-19 ENCOUNTER — Other Ambulatory Visit: Payer: Self-pay | Admitting: Family Medicine

## 2024-08-19 DIAGNOSIS — E063 Autoimmune thyroiditis: Secondary | ICD-10-CM

## 2024-08-19 LAB — COMPREHENSIVE METABOLIC PANEL WITH GFR
AG Ratio: 1.6 (calc) (ref 1.0–2.5)
ALT: 21 U/L (ref 6–29)
AST: 30 U/L (ref 10–35)
Albumin: 4.4 g/dL (ref 3.6–5.1)
Alkaline phosphatase (APISO): 78 U/L (ref 37–153)
BUN: 13 mg/dL (ref 7–25)
CO2: 28 mmol/L (ref 20–32)
Calcium: 9.7 mg/dL (ref 8.6–10.4)
Chloride: 101 mmol/L (ref 98–110)
Creat: 0.83 mg/dL (ref 0.50–1.05)
Globulin: 2.7 g/dL (ref 1.9–3.7)
Glucose, Bld: 81 mg/dL (ref 65–99)
Potassium: 4.9 mmol/L (ref 3.5–5.3)
Sodium: 138 mmol/L (ref 135–146)
Total Bilirubin: 0.5 mg/dL (ref 0.2–1.2)
Total Protein: 7.1 g/dL (ref 6.1–8.1)
eGFR: 78 mL/min/1.73m2

## 2024-08-19 LAB — LIPID PANEL
Cholesterol: 277 mg/dL — ABNORMAL HIGH
HDL: 64 mg/dL
LDL Cholesterol (Calc): 189 mg/dL — ABNORMAL HIGH
Non-HDL Cholesterol (Calc): 213 mg/dL — ABNORMAL HIGH
Total CHOL/HDL Ratio: 4.3 (calc)
Triglycerides: 114 mg/dL

## 2024-08-19 LAB — CBC WITH DIFFERENTIAL/PLATELET
Absolute Lymphocytes: 2125 {cells}/uL (ref 850–3900)
Absolute Monocytes: 468 {cells}/uL (ref 200–950)
Basophils Absolute: 94 {cells}/uL (ref 0–200)
Basophils Relative: 1.1 %
Eosinophils Absolute: 238 {cells}/uL (ref 15–500)
Eosinophils Relative: 2.8 %
HCT: 43.8 % (ref 35.9–46.0)
Hemoglobin: 13.9 g/dL (ref 11.7–15.5)
MCH: 28.5 pg (ref 27.0–33.0)
MCHC: 31.7 g/dL (ref 31.6–35.4)
MCV: 89.9 fL (ref 81.4–101.7)
MPV: 8.9 fL (ref 7.5–12.5)
Monocytes Relative: 5.5 %
Neutro Abs: 5576 {cells}/uL (ref 1500–7800)
Neutrophils Relative %: 65.6 %
Platelets: 365 Thousand/uL (ref 140–400)
RBC: 4.87 Million/uL (ref 3.80–5.10)
RDW: 13.3 % (ref 11.0–15.0)
Total Lymphocyte: 25 %
WBC: 8.5 Thousand/uL (ref 3.8–10.8)

## 2024-08-19 LAB — B12 AND FOLATE PANEL
Folate: >24 ng/mL
Vitamin B-12: 1237 pg/mL — ABNORMAL HIGH (ref 200–1100)

## 2024-08-19 LAB — TSH: TSH: 25.42 m[IU]/L — ABNORMAL HIGH (ref 0.40–4.50)

## 2024-08-19 LAB — VITAMIN D 25 HYDROXY (VIT D DEFICIENCY, FRACTURES): Vit D, 25-Hydroxy: 36 ng/mL (ref 30–100)

## 2024-08-19 MED ORDER — LEVOTHYROXINE SODIUM 25 MCG PO TABS: 25.0000 ug | ORAL_TABLET | ORAL | 0 refills | Status: AC

## 2024-09-02 ENCOUNTER — Ambulatory Visit: Payer: Self-pay | Admitting: Family Medicine

## 2024-09-02 ENCOUNTER — Ambulatory Visit
Admission: RE | Admit: 2024-09-02 | Discharge: 2024-09-02 | Disposition: A | Discharge status: Home / Self Care | Source: Ambulatory Visit | Attending: Family Medicine | Admitting: Family Medicine

## 2024-09-02 DIAGNOSIS — Z13820 Encounter for screening for osteoporosis: Secondary | ICD-10-CM | POA: Insufficient documentation

## 2024-09-02 DIAGNOSIS — Z780 Asymptomatic menopausal state: Secondary | ICD-10-CM | POA: Diagnosis present

## 2024-09-02 DIAGNOSIS — Z0000 Encounter for general adult medical examination without abnormal findings: Secondary | ICD-10-CM | POA: Insufficient documentation

## 2024-09-29 ENCOUNTER — Ambulatory Visit: Admit: 2024-09-29 | Admitting: Ophthalmology

## 2024-10-13 ENCOUNTER — Ambulatory Visit: Admit: 2024-10-13 | Admitting: Ophthalmology

## 2025-02-16 ENCOUNTER — Encounter: Admitting: Family Medicine

## 2025-07-02 ENCOUNTER — Ambulatory Visit
